# Patient Record
Sex: Male | Born: 1940 | Race: White | Hispanic: No | Marital: Single | Smoking: Former smoker
Health system: Southern US, Community
[De-identification: ages and names within clinical notes are randomized; demographics above are authoritative.]

## PROBLEM LIST (undated history)

## (undated) DIAGNOSIS — M1990 Unspecified osteoarthritis, unspecified site: Secondary | ICD-10-CM

## (undated) DIAGNOSIS — F329 Major depressive disorder, single episode, unspecified: Secondary | ICD-10-CM

## (undated) DIAGNOSIS — C61 Malignant neoplasm of prostate: Secondary | ICD-10-CM

## (undated) DIAGNOSIS — I2510 Atherosclerotic heart disease of native coronary artery without angina pectoris: Secondary | ICD-10-CM

## (undated) DIAGNOSIS — N319 Neuromuscular dysfunction of bladder, unspecified: Secondary | ICD-10-CM

## (undated) DIAGNOSIS — G8929 Other chronic pain: Secondary | ICD-10-CM

## (undated) DIAGNOSIS — C4490 Unspecified malignant neoplasm of skin, unspecified: Secondary | ICD-10-CM

## (undated) DIAGNOSIS — E042 Nontoxic multinodular goiter: Secondary | ICD-10-CM

## (undated) DIAGNOSIS — Z9981 Dependence on supplemental oxygen: Secondary | ICD-10-CM

## (undated) DIAGNOSIS — M545 Low back pain, unspecified: Secondary | ICD-10-CM

## (undated) DIAGNOSIS — J449 Chronic obstructive pulmonary disease, unspecified: Secondary | ICD-10-CM

## (undated) DIAGNOSIS — F32A Depression, unspecified: Secondary | ICD-10-CM

## (undated) DIAGNOSIS — I10 Essential (primary) hypertension: Secondary | ICD-10-CM

## (undated) DIAGNOSIS — E7800 Pure hypercholesterolemia, unspecified: Secondary | ICD-10-CM

## (undated) DIAGNOSIS — J189 Pneumonia, unspecified organism: Secondary | ICD-10-CM

## (undated) DIAGNOSIS — I219 Acute myocardial infarction, unspecified: Secondary | ICD-10-CM

## (undated) DIAGNOSIS — Z9289 Personal history of other medical treatment: Secondary | ICD-10-CM

## (undated) HISTORY — PX: TONSILLECTOMY: SUR1361

## (undated) HISTORY — PX: CARPAL TUNNEL RELEASE: SHX101

## (undated) HISTORY — PX: APPENDECTOMY: SHX54

## (undated) HISTORY — PX: INSERTION PROSTATE RADIATION SEED: SUR718

## (undated) HISTORY — PX: CYSTECTOMY: SUR359

## (undated) HISTORY — PX: WRIST FRACTURE SURGERY: SHX121

---

## 1989-01-05 DIAGNOSIS — J189 Pneumonia, unspecified organism: Secondary | ICD-10-CM

## 1989-01-05 HISTORY — DX: Pneumonia, unspecified organism: J18.9

## 1994-05-07 DIAGNOSIS — I219 Acute myocardial infarction, unspecified: Secondary | ICD-10-CM

## 1994-05-07 HISTORY — DX: Acute myocardial infarction, unspecified: I21.9

## 2007-05-08 DIAGNOSIS — Z9289 Personal history of other medical treatment: Secondary | ICD-10-CM

## 2007-05-08 HISTORY — PX: COLON SURGERY: SHX602

## 2007-05-08 HISTORY — DX: Personal history of other medical treatment: Z92.89

## 2011-05-08 HISTORY — PX: TRANSURETHRAL RESECTION OF PROSTATE: SHX73

## 2013-09-14 ENCOUNTER — Inpatient Hospital Stay (HOSPITAL_COMMUNITY): Payer: Medicare Other

## 2013-09-14 ENCOUNTER — Encounter (HOSPITAL_BASED_OUTPATIENT_CLINIC_OR_DEPARTMENT_OTHER): Payer: Self-pay | Admitting: Emergency Medicine

## 2013-09-14 ENCOUNTER — Inpatient Hospital Stay (HOSPITAL_BASED_OUTPATIENT_CLINIC_OR_DEPARTMENT_OTHER)
Admission: EM | Admit: 2013-09-14 | Discharge: 2013-09-17 | DRG: 190 | Disposition: A | Discharge status: Skilled Nursing Facility | Payer: Medicare Other | Attending: Internal Medicine | Admitting: Internal Medicine

## 2013-09-14 ENCOUNTER — Emergency Department (HOSPITAL_BASED_OUTPATIENT_CLINIC_OR_DEPARTMENT_OTHER): Payer: Medicare Other

## 2013-09-14 DIAGNOSIS — Z91199 Patient's noncompliance with other medical treatment and regimen due to unspecified reason: Secondary | ICD-10-CM

## 2013-09-14 DIAGNOSIS — K219 Gastro-esophageal reflux disease without esophagitis: Secondary | ICD-10-CM | POA: Diagnosis present

## 2013-09-14 DIAGNOSIS — E785 Hyperlipidemia, unspecified: Secondary | ICD-10-CM | POA: Diagnosis present

## 2013-09-14 DIAGNOSIS — S82839A Other fracture of upper and lower end of unspecified fibula, initial encounter for closed fracture: Secondary | ICD-10-CM | POA: Diagnosis present

## 2013-09-14 DIAGNOSIS — M1990 Unspecified osteoarthritis, unspecified site: Secondary | ICD-10-CM | POA: Diagnosis present

## 2013-09-14 DIAGNOSIS — J441 Chronic obstructive pulmonary disease with (acute) exacerbation: Secondary | ICD-10-CM

## 2013-09-14 DIAGNOSIS — Z66 Do not resuscitate: Secondary | ICD-10-CM | POA: Diagnosis present

## 2013-09-14 DIAGNOSIS — G2581 Restless legs syndrome: Secondary | ICD-10-CM | POA: Diagnosis present

## 2013-09-14 DIAGNOSIS — IMO0002 Reserved for concepts with insufficient information to code with codable children: Secondary | ICD-10-CM

## 2013-09-14 DIAGNOSIS — R627 Adult failure to thrive: Secondary | ICD-10-CM | POA: Diagnosis present

## 2013-09-14 DIAGNOSIS — Z9181 History of falling: Secondary | ICD-10-CM

## 2013-09-14 DIAGNOSIS — J449 Chronic obstructive pulmonary disease, unspecified: Secondary | ICD-10-CM | POA: Diagnosis present

## 2013-09-14 DIAGNOSIS — W19XXXA Unspecified fall, initial encounter: Secondary | ICD-10-CM

## 2013-09-14 DIAGNOSIS — E042 Nontoxic multinodular goiter: Secondary | ICD-10-CM | POA: Diagnosis present

## 2013-09-14 DIAGNOSIS — J440 Chronic obstructive pulmonary disease with acute lower respiratory infection: Principal | ICD-10-CM | POA: Diagnosis present

## 2013-09-14 DIAGNOSIS — I10 Essential (primary) hypertension: Secondary | ICD-10-CM | POA: Diagnosis present

## 2013-09-14 DIAGNOSIS — M069 Rheumatoid arthritis, unspecified: Secondary | ICD-10-CM | POA: Diagnosis present

## 2013-09-14 DIAGNOSIS — Y92009 Unspecified place in unspecified non-institutional (private) residence as the place of occurrence of the external cause: Secondary | ICD-10-CM

## 2013-09-14 DIAGNOSIS — E7800 Pure hypercholesterolemia, unspecified: Secondary | ICD-10-CM | POA: Diagnosis present

## 2013-09-14 DIAGNOSIS — Z8249 Family history of ischemic heart disease and other diseases of the circulatory system: Secondary | ICD-10-CM

## 2013-09-14 DIAGNOSIS — E2749 Other adrenocortical insufficiency: Secondary | ICD-10-CM | POA: Diagnosis present

## 2013-09-14 DIAGNOSIS — G8929 Other chronic pain: Secondary | ICD-10-CM | POA: Diagnosis present

## 2013-09-14 DIAGNOSIS — Z9119 Patient's noncompliance with other medical treatment and regimen: Secondary | ICD-10-CM

## 2013-09-14 DIAGNOSIS — I2510 Atherosclerotic heart disease of native coronary artery without angina pectoris: Secondary | ICD-10-CM

## 2013-09-14 DIAGNOSIS — Z820 Family history of epilepsy and other diseases of the nervous system: Secondary | ICD-10-CM

## 2013-09-14 DIAGNOSIS — Z79899 Other long term (current) drug therapy: Secondary | ICD-10-CM

## 2013-09-14 DIAGNOSIS — Z9981 Dependence on supplemental oxygen: Secondary | ICD-10-CM

## 2013-09-14 DIAGNOSIS — I252 Old myocardial infarction: Secondary | ICD-10-CM

## 2013-09-14 DIAGNOSIS — E872 Acidosis, unspecified: Secondary | ICD-10-CM | POA: Diagnosis present

## 2013-09-14 DIAGNOSIS — Z602 Problems related to living alone: Secondary | ICD-10-CM

## 2013-09-14 DIAGNOSIS — R000 Tachycardia, unspecified: Secondary | ICD-10-CM | POA: Diagnosis present

## 2013-09-14 DIAGNOSIS — J209 Acute bronchitis, unspecified: Principal | ICD-10-CM | POA: Diagnosis present

## 2013-09-14 DIAGNOSIS — Z87891 Personal history of nicotine dependence: Secondary | ICD-10-CM

## 2013-09-14 DIAGNOSIS — Z7982 Long term (current) use of aspirin: Secondary | ICD-10-CM

## 2013-09-14 DIAGNOSIS — R531 Weakness: Secondary | ICD-10-CM

## 2013-09-14 DIAGNOSIS — Z888 Allergy status to other drugs, medicaments and biological substances status: Secondary | ICD-10-CM

## 2013-09-14 DIAGNOSIS — N319 Neuromuscular dysfunction of bladder, unspecified: Secondary | ICD-10-CM | POA: Diagnosis present

## 2013-09-14 DIAGNOSIS — J9620 Acute and chronic respiratory failure, unspecified whether with hypoxia or hypercapnia: Secondary | ICD-10-CM | POA: Diagnosis present

## 2013-09-14 HISTORY — DX: Essential (primary) hypertension: I10

## 2013-09-14 HISTORY — DX: Dependence on supplemental oxygen: Z99.81

## 2013-09-14 HISTORY — DX: Nontoxic multinodular goiter: E04.2

## 2013-09-14 HISTORY — DX: Unspecified osteoarthritis, unspecified site: M19.90

## 2013-09-14 HISTORY — DX: Low back pain, unspecified: M54.50

## 2013-09-14 HISTORY — DX: Atherosclerotic heart disease of native coronary artery without angina pectoris: I25.10

## 2013-09-14 HISTORY — DX: Major depressive disorder, single episode, unspecified: F32.9

## 2013-09-14 HISTORY — DX: Depression, unspecified: F32.A

## 2013-09-14 HISTORY — DX: Unspecified malignant neoplasm of skin, unspecified: C44.90

## 2013-09-14 HISTORY — DX: Acute myocardial infarction, unspecified: I21.9

## 2013-09-14 HISTORY — DX: Other chronic pain: G89.29

## 2013-09-14 HISTORY — DX: Pure hypercholesterolemia, unspecified: E78.00

## 2013-09-14 HISTORY — DX: Neuromuscular dysfunction of bladder, unspecified: N31.9

## 2013-09-14 HISTORY — DX: Pneumonia, unspecified organism: J18.9

## 2013-09-14 HISTORY — DX: Malignant neoplasm of prostate: C61

## 2013-09-14 HISTORY — DX: Chronic obstructive pulmonary disease, unspecified: J44.9

## 2013-09-14 HISTORY — DX: Low back pain: M54.5

## 2013-09-14 HISTORY — DX: Personal history of other medical treatment: Z92.89

## 2013-09-14 LAB — URINALYSIS, ROUTINE W REFLEX MICROSCOPIC
Glucose, UA: NEGATIVE mg/dL
HGB URINE DIPSTICK: NEGATIVE
KETONES UR: 40 mg/dL — AB
LEUKOCYTES UA: NEGATIVE
Nitrite: NEGATIVE
PH: 7.5 (ref 5.0–8.0)
Protein, ur: 100 mg/dL — AB
Specific Gravity, Urine: 1.02 (ref 1.005–1.030)
Urobilinogen, UA: 1 mg/dL (ref 0.0–1.0)

## 2013-09-14 LAB — I-STAT ARTERIAL BLOOD GAS, ED
ACID-BASE EXCESS: 6 mmol/L — AB (ref 0.0–2.0)
BICARBONATE: 32.2 meq/L — AB (ref 20.0–24.0)
O2 SAT: 94 %
TCO2: 34 mmol/L (ref 0–100)
pCO2 arterial: 50.7 mmHg — ABNORMAL HIGH (ref 35.0–45.0)
pH, Arterial: 7.41 (ref 7.350–7.450)
pO2, Arterial: 71 mmHg — ABNORMAL LOW (ref 80.0–100.0)

## 2013-09-14 LAB — CBC
HCT: 33.5 % — ABNORMAL LOW (ref 39.0–52.0)
Hemoglobin: 10.9 g/dL — ABNORMAL LOW (ref 13.0–17.0)
MCH: 31.1 pg (ref 26.0–34.0)
MCHC: 32.5 g/dL (ref 30.0–36.0)
MCV: 95.7 fL (ref 78.0–100.0)
PLATELETS: 141 10*3/uL — AB (ref 150–400)
RBC: 3.5 MIL/uL — ABNORMAL LOW (ref 4.22–5.81)
RDW: 13.1 % (ref 11.5–15.5)
WBC: 16.6 10*3/uL — AB (ref 4.0–10.5)

## 2013-09-14 LAB — CBC WITH DIFFERENTIAL/PLATELET
BAND NEUTROPHILS: 4 % (ref 0–10)
BLASTS: 0 %
Basophils Absolute: 0 10*3/uL (ref 0.0–0.1)
Basophils Relative: 0 % (ref 0–1)
Eosinophils Absolute: 0.2 10*3/uL (ref 0.0–0.7)
Eosinophils Relative: 1 % (ref 0–5)
HEMATOCRIT: 34.8 % — AB (ref 39.0–52.0)
Hemoglobin: 11.4 g/dL — ABNORMAL LOW (ref 13.0–17.0)
LYMPHS PCT: 6 % — AB (ref 12–46)
Lymphs Abs: 1.1 10*3/uL (ref 0.7–4.0)
MCH: 32.1 pg (ref 26.0–34.0)
MCHC: 32.8 g/dL (ref 30.0–36.0)
MCV: 98 fL (ref 78.0–100.0)
Metamyelocytes Relative: 0 %
Monocytes Absolute: 0.4 10*3/uL (ref 0.1–1.0)
Monocytes Relative: 2 % — ABNORMAL LOW (ref 3–12)
Myelocytes: 0 %
NEUTROS ABS: 16 10*3/uL — AB (ref 1.7–7.7)
NRBC: 0 /100{WBCs}
Neutrophils Relative %: 87 % — ABNORMAL HIGH (ref 43–77)
PROMYELOCYTES ABS: 0 %
Platelets: ADEQUATE 10*3/uL (ref 150–400)
RBC: 3.55 MIL/uL — ABNORMAL LOW (ref 4.22–5.81)
RDW: 12.8 % (ref 11.5–15.5)
Smear Review: ADEQUATE
WBC: 17.7 10*3/uL — ABNORMAL HIGH (ref 4.0–10.5)

## 2013-09-14 LAB — TSH: TSH: 0.157 u[IU]/mL — ABNORMAL LOW (ref 0.350–4.500)

## 2013-09-14 LAB — COMPREHENSIVE METABOLIC PANEL
ALT: 13 U/L (ref 0–53)
AST: 15 U/L (ref 0–37)
Albumin: 3.4 g/dL — ABNORMAL LOW (ref 3.5–5.2)
Alkaline Phosphatase: 95 U/L (ref 39–117)
BILIRUBIN TOTAL: 0.4 mg/dL (ref 0.3–1.2)
BUN: 10 mg/dL (ref 6–23)
CHLORIDE: 98 meq/L (ref 96–112)
CO2: 26 meq/L (ref 19–32)
Calcium: 9.4 mg/dL (ref 8.4–10.5)
Creatinine, Ser: 0.6 mg/dL (ref 0.50–1.35)
GFR calc Af Amer: >90 mL/min (ref >90)
GLUCOSE: 125 mg/dL — AB (ref 70–99)
Potassium: 4 mEq/L (ref 3.7–5.3)
SODIUM: 139 meq/L (ref 137–147)
Total Protein: 7.2 g/dL (ref 6.0–8.3)

## 2013-09-14 LAB — URINE MICROSCOPIC-ADD ON

## 2013-09-14 LAB — CREATININE, SERUM
Creatinine, Ser: 0.52 mg/dL (ref 0.50–1.35)
GFR calc Af Amer: >90 mL/min (ref >90)
GFR calc non Af Amer: >90 mL/min (ref >90)

## 2013-09-14 LAB — CK: Total CK: 82 U/L (ref 7–232)

## 2013-09-14 LAB — PRO B NATRIURETIC PEPTIDE: PRO B NATRI PEPTIDE: 227.7 pg/mL — AB (ref 0–125)

## 2013-09-14 LAB — TROPONIN I: Troponin I: <0.3 ng/mL (ref <0.30)

## 2013-09-14 MED ORDER — PANTOPRAZOLE SODIUM 40 MG PO TBEC
40.0000 mg | DELAYED_RELEASE_TABLET | Freq: Every day | ORAL | Status: DC
  Administered 2013-09-14 – 2013-09-17 (×4): 40 mg via ORAL
  Filled 2013-09-14 (×4): qty 1

## 2013-09-14 MED ORDER — ROPINIROLE HCL 1 MG PO TABS
2.0000 mg | ORAL_TABLET | Freq: Every day | ORAL | Status: DC
  Administered 2013-09-14 – 2013-09-16 (×3): 2 mg via ORAL
  Filled 2013-09-14 (×4): qty 2

## 2013-09-14 MED ORDER — BUDESONIDE-FORMOTEROL FUMARATE 160-4.5 MCG/ACT IN AERO
2.0000 | INHALATION_SPRAY | Freq: Two times a day (BID) | RESPIRATORY_TRACT | Status: DC
  Administered 2013-09-14 – 2013-09-17 (×6): 2 via RESPIRATORY_TRACT
  Filled 2013-09-14: qty 6

## 2013-09-14 MED ORDER — ASPIRIN 81 MG PO TABS: 81.0000 mg | ORAL_TABLET | Freq: Every day | ORAL | Status: DC

## 2013-09-14 MED ORDER — POLYETHYLENE GLYCOL 3350 17 G PO PACK
17.0000 g | PACK | Freq: Every day | ORAL | Status: DC
  Administered 2013-09-14 – 2013-09-17 (×4): 17 g via ORAL
  Filled 2013-09-14 (×4): qty 1

## 2013-09-14 MED ORDER — LEVOFLOXACIN 750 MG PO TABS
750.0000 mg | ORAL_TABLET | Freq: Once | ORAL | Status: AC
  Administered 2013-09-14: 750 mg via ORAL
  Filled 2013-09-14: qty 1

## 2013-09-14 MED ORDER — HYDROCODONE-ACETAMINOPHEN 10-325 MG PO TABS
1.0000 | ORAL_TABLET | Freq: Four times a day (QID) | ORAL | Status: DC | PRN
  Administered 2013-09-15 – 2013-09-17 (×8): 1 via ORAL
  Filled 2013-09-14 (×6): qty 1
  Filled 2013-09-14: qty 2
  Filled 2013-09-14 (×2): qty 1

## 2013-09-14 MED ORDER — ALBUTEROL SULFATE (2.5 MG/3ML) 0.083% IN NEBU
2.5000 mg | INHALATION_SOLUTION | RESPIRATORY_TRACT | Status: DC | PRN
  Administered 2013-09-16 – 2013-09-17 (×2): 2.5 mg via RESPIRATORY_TRACT
  Filled 2013-09-14 (×3): qty 3

## 2013-09-14 MED ORDER — CALCIUM CARBONATE 600 MG PO TABS: 600.0000 mg | ORAL_TABLET | Freq: Two times a day (BID) | ORAL | Status: DC

## 2013-09-14 MED ORDER — TIOTROPIUM BROMIDE MONOHYDRATE 18 MCG IN CAPS: 18.0000 ug | ORAL_CAPSULE | Freq: Every day | RESPIRATORY_TRACT | Status: DC

## 2013-09-14 MED ORDER — NICOTINE POLACRILEX 4 MG MT LOZG
4.0000 mg | LOZENGE | OROMUCOSAL | Status: DC | PRN
  Filled 2013-09-14: qty 1

## 2013-09-14 MED ORDER — PREDNISONE 50 MG PO TABS
60.0000 mg | ORAL_TABLET | Freq: Every day | ORAL | Status: DC
  Administered 2013-09-15 – 2013-09-17 (×3): 60 mg via ORAL
  Filled 2013-09-14 (×4): qty 1

## 2013-09-14 MED ORDER — CALCIUM CARBONATE 1250 (500 CA) MG PO TABS
1.0000 | ORAL_TABLET | Freq: Two times a day (BID) | ORAL | Status: DC
  Administered 2013-09-14 – 2013-09-17 (×7): 500 mg via ORAL
  Filled 2013-09-14 (×8): qty 1

## 2013-09-14 MED ORDER — TRAZODONE HCL 100 MG PO TABS
100.0000 mg | ORAL_TABLET | Freq: Every day | ORAL | Status: DC
  Administered 2013-09-14 – 2013-09-16 (×3): 100 mg via ORAL
  Filled 2013-09-14 (×4): qty 1

## 2013-09-14 MED ORDER — CARVEDILOL 12.5 MG PO TABS
12.5000 mg | ORAL_TABLET | Freq: Two times a day (BID) | ORAL | Status: DC
  Administered 2013-09-14 – 2013-09-17 (×7): 12.5 mg via ORAL
  Filled 2013-09-14 (×8): qty 1

## 2013-09-14 MED ORDER — DOCUSATE SODIUM 100 MG PO CAPS
100.0000 mg | ORAL_CAPSULE | Freq: Two times a day (BID) | ORAL | Status: DC
  Administered 2013-09-14 – 2013-09-17 (×4): 100 mg via ORAL
  Filled 2013-09-14 (×7): qty 1

## 2013-09-14 MED ORDER — HYDROCODONE-ACETAMINOPHEN 5-325 MG PO TABS
2.0000 | ORAL_TABLET | Freq: Once | ORAL | Status: AC
  Administered 2013-09-14: 2 via ORAL
  Filled 2013-09-14: qty 2

## 2013-09-14 MED ORDER — HEPARIN SODIUM (PORCINE) 5000 UNIT/ML IJ SOLN
5000.0000 [IU] | Freq: Three times a day (TID) | INTRAMUSCULAR | Status: DC
  Administered 2013-09-14 – 2013-09-16 (×6): 5000 [IU] via SUBCUTANEOUS
  Filled 2013-09-14 (×9): qty 1

## 2013-09-14 MED ORDER — ASPIRIN 81 MG PO CHEW
81.0000 mg | CHEWABLE_TABLET | Freq: Every day | ORAL | Status: DC
  Administered 2013-09-14 – 2013-09-17 (×4): 81 mg via ORAL
  Filled 2013-09-14 (×4): qty 1

## 2013-09-14 MED ORDER — TIOTROPIUM BROMIDE MONOHYDRATE 18 MCG IN CAPS
18.0000 ug | ORAL_CAPSULE | Freq: Every day | RESPIRATORY_TRACT | Status: DC
  Administered 2013-09-15 – 2013-09-17 (×3): 18 ug via RESPIRATORY_TRACT
  Filled 2013-09-14: qty 5

## 2013-09-14 MED ORDER — BIOTENE DRY MOUTH MT LIQD
15.0000 mL | Freq: Two times a day (BID) | OROMUCOSAL | Status: DC
  Administered 2013-09-14 – 2013-09-17 (×6): 15 mL via OROMUCOSAL

## 2013-09-14 MED ORDER — ALBUTEROL SULFATE (2.5 MG/3ML) 0.083% IN NEBU
2.5000 mg | INHALATION_SOLUTION | Freq: Four times a day (QID) | RESPIRATORY_TRACT | Status: DC
  Administered 2013-09-14: 2.5 mg via RESPIRATORY_TRACT

## 2013-09-14 NOTE — ED Notes (Signed)
[facility] was notified and they have no truck until 5 or 6 pm.  Was told to call [facility] per [place] ---they should have one within the next hour.

## 2013-09-14 NOTE — H&P (Signed)
[facility] History and Physical  Christopher Orozco ZOX:[id number] DOB: [birth year] DOA: 09/14/2013  Referring physician: ED PCP: No primary provider on file.  Specialists: none  Chief Complaint: Dizzy and Sob  HPI: 73 y/o ? CAd 1996-had NSTEMI, COPD, Gold stage 'D' on chronic home o@ for at least past 3 yryrs-smoker till June 2014 [smoked ~ 2ppd], Rh arthritis [used to be on steroids-tapering now slowly per pulm], Restless leg syndrome, Bladder issues-TURP with incontinence-suppsoed tio Intermittently cath, Degen disc diseaserecent transplant from [place] with 1-2 /7 h/o ?  weakness and sob, feel this am on going to Rr .  Couldn't get up--sats were 70-72% on 3 liters.  Has had ~ 40 lb weight gain, with ? abd girth as well as Le swelling and has been told to wear compression hose which he is non compliant with. He has had no PND and usually uses 3-4 pills to sleep upright at night and has been sleeping on the couch otherwise not currently has a hospital bed He has had no specific lower extremity swelling He has had no chest pain no nausea no vomiting no sick contacts no ill contacts no chills no dysuria no diarrhea no productive cough. He fell down and seems to follow him on his left ankle as well as his left knee and thinks that he twisted his ankle. He did not have any dizziness or any pre-syncopalhe just felt weak overall and did not have any spinning of his head or presyncopal episodes He's never had a stroke or seizure  Used to be more active-Has been declining and [facility] has been asking about routine screening such as AAA screening, colonoscopy and his daughter [ who is an Therapist, sports with [employer] ] has had discussions with PCP at [place] regarding COde status as well as other concerns.  Review of Systems:  10 item 12 organ system review as above  Past Medical History  Diagnosis Date  . COPD (chronic obstructive pulmonary disease)   . Coronary artery disease   . MI, old 51  . Hypertension   .  Elevated cholesterol   . Chronic pain   . Arthritis   . Multiple thyroid nodules   . Neurogenic bladder     History reviewed. No pertinent past surgical history. Social History:  History   Social History Narrative  . No narrative on file    Allergies  Allergen Reactions  . Bupropion     disorientation  . Gabapentin     disorientation  . Ultram [Tramadol] Other (See Comments)    disorientation    History reviewed. No pertinent family history.  Mother had congestive heart failure Father had Alzheimer's dementia   Prior to Admission medications   Medication Sig Start Date End Date Taking? Authorizing Provider  albuterol (PROVENTIL HFA;VENTOLIN HFA) 108 (90 BASE) MCG/ACT inhaler Inhale into the lungs every 6 (six) hours as needed for wheezing or shortness of breath.   Yes Historical Provider, MD  aspirin 81 MG tablet Take 81 mg by mouth daily.   Yes Historical Provider, MD  budesonide-formoterol (SYMBICORT) 160-4.5 MCG/ACT inhaler Inhale 2 puffs into the lungs 2 (two) times daily.   Yes Historical Provider, MD  calcium carbonate (OS-CAL) 600 MG TABS tablet Take 600 mg by mouth 2 (two) times daily with a meal.   Yes Historical Provider, MD  carvedilol (COREG) 12.5 MG tablet Take 12.5 mg by mouth 2 (two) times daily with a meal.   Yes Historical Provider, MD  docusate sodium (COLACE)  100 MG capsule Take 100 mg by mouth 2 (two) times daily.   Yes Historical Provider, MD  HYDROcodone-acetaminophen (NORCO) 10-325 MG per tablet Take 1 tablet by mouth every 6 (six) hours as needed.   Yes Historical Provider, MD  nicotine polacrilex (COMMIT) 4 MG lozenge Take 4 mg by mouth as needed for smoking cessation.   Yes Historical Provider, MD  omeprazole (PRILOSEC) 20 MG capsule Take 20 mg by mouth daily.   Yes Historical Provider, MD  polyethylene glycol (MIRALAX / GLYCOLAX) packet Take 17 g by mouth daily.   Yes Historical Provider, MD  predniSONE (DELTASONE) 10 MG tablet Take 10 mg by mouth  daily with breakfast. Tapering dose per PCP   Yes Historical Provider, MD  rOPINIRole (REQUIP) 2 MG tablet Take 2 mg by mouth at bedtime.   Yes Historical Provider, MD  simvastatin (ZOCOR) 40 MG tablet Take 40 mg by mouth daily.   Yes Historical Provider, MD  tiotropium (SPIRIVA) 18 MCG inhalation capsule Place 18 mcg into inhaler and inhale daily.   Yes Historical Provider, MD  traZODone (DESYREL) 100 MG tablet Take 100 mg by mouth at bedtime.   Yes Historical Provider, MD   Physical Exam: Filed Vitals:   09/14/13 0929 09/14/13 1229  BP: 136/72 143/60  Pulse: 94 96  Temp: 98.4 F (36.9 C) 98 F (36.7 C)  TempSrc: Oral Oral  Resp: 24 16  SpO2: 100% 96%     General:   alert, slightly disheveled, anicteric, no pallor  Eyes:  EOMI, fundus not evaluated,  ENT: Midline uvula, facial symmetry present,   Neck: Soft supple no thyromegaly noted, no JVD noted  Cardiovascular:  S1-S2 regular rate rhythm , no murmur no rub   Respiratory: Decreased air entry bilaterally posteriorly. No fremitus or resonance   Abdomen:  soft nontender nondistended  Skin:  no lower extremity edema  Musculoskeletal:  range of motion to the left ankle is slightly painful . Anteroposterior torus testis slightly positive . Palpation of the navicular bones does not reveal any overt tenderness . Inversion  , he version is uncomfortable but not painful   Psychiatric: Euthymic   Neurologic: Grossly intact   Labs on Admission:  Basic Metabolic Panel:  Recent Labs Lab 09/14/13 1010  NA 139  K 4.0  CL 98  CO2 26  GLUCOSE 125*  BUN 10  CREATININE 0.60  CALCIUM 9.4   Liver Function Tests:  Recent Labs Lab 09/14/13 1010  AST 15  ALT 13  ALKPHOS 95  BILITOT 0.4  PROT 7.2  ALBUMIN 3.4*   No results found for this basename: LIPASE, AMYLASE,  in the last 168 hours No results found for this basename: AMMONIA,  in the last 168 hours CBC:  Recent Labs Lab 09/14/13 1010  WBC 17.7*  NEUTROABS  16.0*  HGB 11.4*  HCT 34.8*  MCV 98.0  PLT PLATELET CLUMPS NOTED ON SMEAR, COUNT APPEARS ADEQUATE   Cardiac Enzymes:  Recent Labs Lab 09/14/13 1010  CKTOTAL 82  TROPONINI <0.30    BNP (last 3 results) No results found for this basename: PROBNP,  in the last 8760 hours CBG: No results found for this basename: GLUCAP,  in the last 168 hours  Radiological Exams on Admission: Dg Chest 2 View  09/14/2013   CLINICAL DATA:  Shortness of breath.  EXAM: CHEST  2 VIEW  COMPARISON:  None.  FINDINGS: There is hyperinflation of the lungs compatible with COPD. No confluent opacities or effusions. Heart is  normal size. Mild peribronchial thickening and interstitial prominence, likely bronchitis.  IMPRESSION: COPD/ bronchitic changes.   Electronically Signed   By: Rolm Baptise M.D.   On: 09/14/2013 11:11   Dg Hip Complete Left  09/14/2013   CLINICAL DATA:  Fall, pain.  EXAM: LEFT HIP - COMPLETE 2+ VIEW  COMPARISON:  None.  FINDINGS: Mild symmetric joint space narrowing within the hip joints bilaterally. No acute bony abnormality. Specifically, no fracture, subluxation, or dislocation. Soft tissues are intact. Radiation seeds in the region of the prostate.  IMPRESSION: No acute bony abnormality.   Electronically Signed   By: Rolm Baptise M.D.   On: 09/14/2013 10:53    EKG: Independently reviewed. Chest x-ray 120 PR interval 0 0.04 , QRS axis 70 , and ST-T wave inversions   Assessment/Plan Active Problems:    Acute mixed respiratory failure-elevated CO2 component as well as hypoxia on ABG. Currently in no distress. Will get a screening proBNP to rule out covert heart failure.  EKG does not show any LVH therefore I do not think echo is indicated at this point in time  COPD (chronic obstructive pulmonary disease)-patient hypoxic is resolved. Will give nebulizations every 4 scheduled overnight and reassess in the morning.  Will continue oxygen 3 L an attempt to wean further. We will give a burst of  steroids 60 mg prednisone. He will continue on his Symbicort twice a day. In addition we will add tiotropium to his medications to see if this helps   Failure to thrive-daughter relates slow progressive decline over the past year with decreased mobility. We'll have therapy see him once x-rays are performed--He may need Pallaitive eval eevntually from what daughter states   Fall at home-patient fell and had hip x-rays. He's complaining of ankle pain therefore we'll get knee and ankle x-rays of the left lower extremity   Hypertension-he will continue Coreg 12.5 milligrams daily   Elevated cholesterol-hold statin for now   Chronic pain-patient has had chronic pain for a long time at least 10 years to 12 years and has been told in the past that he does not have rheumatoid arthritis.Marland Kitchen continue his current pain medications.     Multiple thyroid nodules-as reported as benign and has not wanted a workup . Get a screening TSH    Neurogenic bladder-would like to avoid catheter . Check postvoid residuals if patient does not go for a long period of time    Arthritis-see above    CAD (coronary artery disease)-currently quite.  Continue aspirin    65 minutes Long discussion with daughter at bedside Inpatient DO NOT RESUSCITATE confirmed at bedside   [facility] Pager [phone]  If 7PM-7AM, please contact night-coverage [email or web address] Password [email or web address] 09/14/2013, 3:08 PM

## 2013-09-14 NOTE — ED Notes (Signed)
Per EMS:  Pt from home.  States that he fell when trying to get to the bathroom this morning.  Reports that his COPD was worsened when he was trying to get up.  Pt did have BM on himself.  22 ga (R) hand.  2 duo nebs.  125mg  solu-medrol.

## 2013-09-14 NOTE — ED Provider Notes (Addendum)
CSN: [id number]     Arrival date & time 09/14/13  9509 History   First MD Initiated Contact with Patient 09/14/13 0930     Chief Complaint  Patient presents with  . Fall     (Consider location/radiation/quality/duration/timing/severity/associated sxs/prior Treatment) Patient is a 73 y.o. male presenting with fall and shortness of breath. The history is provided by the patient.  Fall This is a recurrent problem. The current episode started 1 to 2 hours ago. The problem occurs constantly. The problem has not changed since onset.Associated symptoms include shortness of breath. Pertinent negatives include no chest pain and no abdominal pain. Associated symptoms comments: Was walking to the bathroom and became so weak his legs gave out and he was unable to get up.  Denies head injury, neck injury or LOC.. Nothing aggravates the symptoms. Nothing relieves the symptoms. He has tried nothing for the symptoms.  Shortness of Breath Severity:  Moderate Onset quality:  Gradual Duration:  1 week Timing:  Constant Progression:  Worsening Chronicity: chronic COPD on 4L of O2 but worse over the last 1 week. Context: not URI   Relieved by:  Sitting up, rest and oxygen Worsened by:  Activity and coughing Associated symptoms: cough, sputum production and wheezing   Associated symptoms: no abdominal pain, no chest pain, no diaphoresis, no fever, no syncope and no vomiting   Risk factors comment:  Copd   Past Medical History  Diagnosis Date  . COPD (chronic obstructive pulmonary disease)    History reviewed. No pertinent past surgical history. History reviewed. No pertinent family history. History  Substance Use Topics  . Smoking status: Unknown If Ever Smoked  . Smokeless tobacco: Not on file  . Alcohol Use: Not on file    Review of Systems  Constitutional: Negative for fever and diaphoresis.  Respiratory: Positive for cough, sputum production, shortness of breath and wheezing.    Cardiovascular: Negative for chest pain and syncope.  Gastrointestinal: Negative for vomiting and abdominal pain.  All other systems reviewed and are negative.     Allergies  Review of patient's allergies indicates not on file.  Home Medications   Prior to Admission medications   Not on File   There were no vitals taken for this visit. Physical Exam  Nursing note and vitals reviewed. Constitutional: He is oriented to person, place, and time. He appears well-developed and well-nourished. No distress.  HENT:  Head: Normocephalic and atraumatic.  Mouth/Throat: Oropharynx is clear and moist.  Eyes: Conjunctivae and EOM are normal. Pupils are equal, round, and reactive to light.  Neck: Normal range of motion. Neck supple. No spinous process tenderness and no muscular tenderness present.  Cardiovascular: Regular rhythm and intact distal pulses.  Tachycardia present.   No murmur heard. Pulmonary/Chest: No accessory muscle usage. Tachypnea noted. No respiratory distress. He has wheezes. He has no rales.  Only able to speak in few word sentences  Abdominal: Soft. He exhibits no distension. There is no tenderness. There is no rebound and no guarding.  Musculoskeletal: Normal range of motion. He exhibits tenderness. He exhibits no edema.  Mild tenderness with range of motion of left hip.  No deformity and 2+ DP pulse with normal sensation  Neurological: He is alert and oriented to person, place, and time.  Skin: Skin is warm and dry. No rash noted. No erythema.  Psychiatric: He has a normal mood and affect. His behavior is normal.    ED Course  Procedures (including critical care time) Labs Review  Labs Reviewed  CBC WITH DIFFERENTIAL - Abnormal; Notable for the following:    WBC 17.7 (*)    RBC 3.55 (*)    Hemoglobin 11.4 (*)    HCT 34.8 (*)    Neutrophils Relative % 87 (*)    Lymphocytes Relative 6 (*)    Monocytes Relative 2 (*)    Neutro Abs 16.0 (*)    All other components  within normal limits  COMPREHENSIVE METABOLIC PANEL - Abnormal; Notable for the following:    Glucose, Bld 125 (*)    Albumin 3.4 (*)    All other components within normal limits  URINALYSIS, ROUTINE W REFLEX MICROSCOPIC - Abnormal; Notable for the following:    Bilirubin Urine SMALL (*)    Ketones, ur 40 (*)    Protein, ur 100 (*)    All other components within normal limits  URINE MICROSCOPIC-ADD ON - Abnormal; Notable for the following:    Casts HYALINE CASTS (*)    All other components within normal limits  I-STAT ARTERIAL BLOOD GAS, ED - Abnormal; Notable for the following:    pCO2 arterial 50.7 (*)    pO2, Arterial 71.0 (*)    Bicarbonate 32.2 (*)    Acid-Base Excess 6.0 (*)    All other components within normal limits  CK  TROPONIN I    Imaging Review Dg Chest 2 View  09/14/2013   CLINICAL DATA:  Shortness of breath.  EXAM: CHEST  2 VIEW  COMPARISON:  None.  FINDINGS: There is hyperinflation of the lungs compatible with COPD. No confluent opacities or effusions. Heart is normal size. Mild peribronchial thickening and interstitial prominence, likely bronchitis.  IMPRESSION: COPD/ bronchitic changes.   Electronically Signed   By: Rolm Baptise M.D.   On: 09/14/2013 11:11   Dg Hip Complete Left  09/14/2013   CLINICAL DATA:  Fall, pain.  EXAM: LEFT HIP - COMPLETE 2+ VIEW  COMPARISON:  None.  FINDINGS: Mild symmetric joint space narrowing within the hip joints bilaterally. No acute bony abnormality. Specifically, no fracture, subluxation, or dislocation. Soft tissues are intact. Radiation seeds in the region of the prostate.  IMPRESSION: No acute bony abnormality.   Electronically Signed   By: Rolm Baptise M.D.   On: 09/14/2013 10:53     EKG Interpretation   Date/Time:  Monday Sep 14 2013 09:54:44 EDT Ventricular Rate:  120 PR Interval:  152 QRS Duration: 88 QT Interval:  318 QTC Calculation: 449 R Axis:   81 Text Interpretation:  Sinus tachycardia Otherwise normal ECG No  previous  tracing Confirmed by Maryan Rued  MD, Loree Fee ([id number]) on 09/14/2013 10:31:31  AM      MDM   Final diagnoses:  COPD exacerbation  Weakness  Fall    Patient presents after a fall at home when he states he was too weak to walk to the bathroom. He denies head injury or LOC intakes no anticoagulations. Patient has a history of COPD on 4 L of oxygen chronically. He states over the last one week he said worsening shortness of breath and productive cough. 2 days ago he admits he was too weak to get off the couch and laid on the couch for about 20 hours before he was finally able to get up. He lives alone but his daughter is about 10 minutes away. He called her today when he fell and EMS arrived. Patient was given Solu-Medrol and to do nebs in route and upon arrival here he is wheezing diffusely but  satting 93% on his home liters. He has some mild left hip pain low suspicion for fracture at this time. No other signs of injury. Concern for worsening COPD exacerbation versus infection causing patient's weakness and fall today. Low suspicion for any acute injury.  We'll continue to monitor.  CBC, CMP, CK, troponin, ABG, chest x-ray, film pending  12:40 PM Leukocytosis of 17.000 and ABG with compensated resp acidosis.  Chest x-ray is without acute findings the film is negative. UA without signs of infection. The patient would benefit from admission to generalized weakness and COPD exacerbation with multiple falls. Discussed this with patient and family and they're agreeable to the plan.  Blanchie Dessert, MD 09/14/13 1241  Blanchie Dessert, MD 09/14/13 1241  Blanchie Dessert, MD 09/14/13 1249

## 2013-09-14 NOTE — Progress Notes (Signed)
73 year old male with history of COPD on 4 L of oxygen chronically, history of coronary artery disease presented to [facility] today for worsening shortness of breath and productive cough. Patient has also been feeling weak for several days and fell today at home. Patient did not hit his head. Chest x-ray negative for infiltrates, ABG appears to be okay. Patient has had wheezing, given albuterol nebulizers. Admit for COPD exacerbation  Accepted, team 10, medical floor.

## 2013-09-14 NOTE — ED Notes (Signed)
Right Radial Artery x 1 attempt to obtain ABG and labs.  Pressure held till bleeding stopped.  No complications noted.

## 2013-09-15 DIAGNOSIS — W19XXXA Unspecified fall, initial encounter: Secondary | ICD-10-CM

## 2013-09-15 DIAGNOSIS — I2510 Atherosclerotic heart disease of native coronary artery without angina pectoris: Secondary | ICD-10-CM

## 2013-09-15 DIAGNOSIS — I10 Essential (primary) hypertension: Secondary | ICD-10-CM

## 2013-09-15 LAB — COMPREHENSIVE METABOLIC PANEL
ALK PHOS: 101 U/L (ref 39–117)
ALT: 12 U/L (ref 0–53)
AST: 16 U/L (ref 0–37)
Albumin: 3.2 g/dL — ABNORMAL LOW (ref 3.5–5.2)
BUN: 14 mg/dL (ref 6–23)
CHLORIDE: 98 meq/L (ref 96–112)
CO2: 30 mEq/L (ref 19–32)
Calcium: 9.4 mg/dL (ref 8.4–10.5)
Creatinine, Ser: 0.52 mg/dL (ref 0.50–1.35)
GFR calc non Af Amer: >90 mL/min (ref >90)
GLUCOSE: 141 mg/dL — AB (ref 70–99)
Potassium: 3.9 mEq/L (ref 3.7–5.3)
SODIUM: 140 meq/L (ref 137–147)
TOTAL PROTEIN: 7 g/dL (ref 6.0–8.3)

## 2013-09-15 LAB — CBC
HEMATOCRIT: 31.5 % — AB (ref 39.0–52.0)
Hemoglobin: 10.2 g/dL — ABNORMAL LOW (ref 13.0–17.0)
MCH: 30.8 pg (ref 26.0–34.0)
MCHC: 32.4 g/dL (ref 30.0–36.0)
MCV: 95.2 fL (ref 78.0–100.0)
PLATELETS: 168 10*3/uL (ref 150–400)
RBC: 3.31 MIL/uL — ABNORMAL LOW (ref 4.22–5.81)
RDW: 13.1 % (ref 11.5–15.5)
WBC: 15.6 10*3/uL — AB (ref 4.0–10.5)

## 2013-09-15 LAB — PROTIME-INR
INR: 1.01 (ref 0.00–1.49)
Prothrombin Time: 13.1 seconds (ref 11.6–15.2)

## 2013-09-15 MED ORDER — SIMVASTATIN 40 MG PO TABS
40.0000 mg | ORAL_TABLET | Freq: Every day | ORAL | Status: DC
  Administered 2013-09-15 – 2013-09-17 (×3): 40 mg via ORAL
  Filled 2013-09-15 (×3): qty 1

## 2013-09-15 MED ORDER — FUROSEMIDE 10 MG/ML IJ SOLN
20.0000 mg | Freq: Once | INTRAMUSCULAR | Status: AC
  Administered 2013-09-15: 20 mg via INTRAVENOUS
  Filled 2013-09-15: qty 2

## 2013-09-15 MED ORDER — ALBUTEROL SULFATE (2.5 MG/3ML) 0.083% IN NEBU
2.5000 mg | INHALATION_SOLUTION | Freq: Three times a day (TID) | RESPIRATORY_TRACT | Status: DC
  Administered 2013-09-15 – 2013-09-17 (×7): 2.5 mg via RESPIRATORY_TRACT
  Filled 2013-09-15 (×8): qty 3

## 2013-09-15 NOTE — Evaluation (Signed)
Physical Therapy Evaluation Patient Details Name: Christopher Orozco MRN: [id number] DOB: November 17, 1940 Today's Date: 09/15/2013   History of Present Illness  Patient is a 73 y.o. male presenting with fall and shortness of breath.  Clinical Impression  Pt admitted with/for falls and sob.  Pt currently limited functionally due to the problems listed. ( See problems list.)   Pt will benefit from PT to maximize function and safety in order to get ready for next venue listed below.     Follow Up Recommendations SNF    Equipment Recommendations  None recommended by PT    Recommendations for Other Services       Precautions / Restrictions Precautions Precautions: Fall Restrictions Weight Bearing Restrictions: No      Mobility  Bed Mobility Overal bed mobility: Needs Assistance Bed Mobility: Supine to Sit     Supine to sit: Supervision (with rail)     General bed mobility comments: safe with use of rail  Transfers Overall transfer level: Needs assistance   Transfers: Sit to/from Stand;Stand Pivot Transfers Sit to Stand: Min assist;From elevated surface         General transfer comment: heavier assist for lower toilet  Ambulation/Gait Ambulation/Gait assistance: Min guard Ambulation Distance (Feet): 15 Feet (to bathroom and 25 across room to chair) Assistive device: Rolling walker (2 wheeled) Gait Pattern/deviations: Step-through pattern   Gait velocity interpretation: Below normal speed for age/gender General Gait Details: mildly unsteady gait with flexed posture  Stairs            Wheelchair Mobility    Modified Rankin (Stroke Patients Only)       Balance Overall balance assessment: No apparent balance deficits (not formally assessed)                                           Pertinent Vitals/Pain     Home Living Family/patient expects to be discharged to:: Private residence Living Arrangements: Alone Available Help at Discharge:  Available PRN/intermittently;Family Type of Home: Apartment Home Access: Stairs to enter   [employer] of Steps: ?couple Home Layout: One level Home Equipment: Environmental consultant - 4 wheels;Cane - single point;Shower seat;Wheelchair - manual;Electric scooter;Grab bars - tub/shower;Hospital bed      Prior Function Level of Independence: Independent with assistive device(s)               Hand Dominance        Extremity/Trunk Assessment               Lower Extremity Assessment: Generalized weakness         Communication   Communication: No difficulties  Cognition Arousal/Alertness: Awake/alert Behavior During Therapy: WFL for tasks assessed/performed Overall Cognitive Status: Within Functional Limits for tasks assessed                      General Comments      Exercises        Assessment/Plan    PT Assessment Patient needs continued PT services  PT Diagnosis Difficulty walking;Generalized weakness   PT Problem List Decreased strength;Decreased activity tolerance;Decreased balance;Decreased mobility;Decreased knowledge of use of DME;Cardiopulmonary status limiting activity  PT Treatment Interventions DME instruction;Gait training;Functional mobility training;Therapeutic activities;Patient/family education;Balance training   PT Goals (Current goals can be found in the Care Plan section) Acute Rehab PT Goals Patient Stated Goal: I (ultimately ) want to get home  PT Goal Formulation: With patient Time For Goal Achievement: 09/29/13 Potential to Achieve Goals: Good    Frequency Min 3X/week   Barriers to discharge Decreased caregiver support      Co-evaluation               End of Session Equipment Utilized During Treatment: Oxygen Activity Tolerance: Patient tolerated treatment well;Patient limited by fatigue Patient left: in chair;with call bell/phone within reach Nurse Communication: Mobility status         Time: 5361-4431 PT  Time Calculation (min): 37 min   Charges:   PT Evaluation $Initial PT Evaluation Tier I: 1 Procedure PT Treatments $Gait Training: 8-22 mins $Therapeutic Activity: 8-22 mins   PT G CodesTessie Fass Meili Kleckley 09/15/2013, 5:51 PM 09/15/2013  Donnella Sham, PT [phone]  (pager)

## 2013-09-15 NOTE — Progress Notes (Addendum)
PATIENT DETAILS Name: Christopher Orozco Age: 73 y.o. Sex: male Date of Birth: [birth year] Admit Date: 09/14/2013 Admitting Physician Cristal Ford, DO PCP:No primary provider on file.  Subjective: Mild improvement in SOB.  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 Likely secondary to acute COPD exacerbation - Continue with oxygen-uses 4 L of oxygen at home at all times.  COPD exacerbation (history of  COPD Gold Stage D) - Diminished air entry everywhere but is moving air. Only scattered rhonchi. - Continue DuoNeb, levofloxacin and slowly taper prednisone.  Falls at home - Nonfocal exam. Left knee x-ray confirms small nondisplaced left fibular neck fracture. Spoke to Dr. Inda Merlin, nonsurgical, okay to walk around with a walker. Followup in his office. - PT eval, suspect we'll need SNF on discharge.  Left fibular neck fracture -Left knee x-ray confirms small nondisplaced left fibular neck fracture. Spoke to Dr. Lorin Mercy, nonsurgical, okay to walk around with a walker. Followup in his office.  HTN - Controlled, continue Coreg.  Dyslipidemia - Continue with statins.  Gastroesophageal reflux disease - Continue PPI.  History of CAD - Stable, no chest pain. Continue aspirin, beta blocker and statins.  History of restless leg syndrome - Continue Requip  Disposition: Remain inpatient  DVT Prophylaxis: Prophylactic  Heparin   Code Status: Full code   Family Communication None at bedside  Procedures:  None  CONSULTS:  None  Time spent 40 minutes-which includes 50% of the time with face-to-face with patient/ family and coordinating care related to the above assessment and plan.    MEDICATIONS: Scheduled Meds: . albuterol  2.5 mg Nebulization TID  . antiseptic oral rinse  15 mL Mouth Rinse BID  . aspirin  81 mg Oral Daily  . budesonide-formoterol  2 puff Inhalation BID  . calcium carbonate   1 tablet Oral BID WC  . carvedilol  12.5 mg Oral BID WC  . docusate sodium  100 mg Oral BID  . furosemide  20 mg Intravenous Once  . heparin  5,000 Units Subcutaneous 3 times per day  . pantoprazole  40 mg Oral Daily  . polyethylene glycol  17 g Oral Daily  . predniSONE  60 mg Oral Q breakfast  . rOPINIRole  2 mg Oral QHS  . tiotropium  18 mcg Inhalation Daily  . traZODone  100 mg Oral QHS   Continuous Infusions:  PRN Meds:.albuterol, HYDROcodone-acetaminophen, nicotine polacrilex  Antibiotics: Anti-infectives   Start     Dose/Rate Route Frequency Ordered Stop   09/14/13 1300  levofloxacin (LEVAQUIN) tablet 750 mg     750 mg Oral  Once 09/14/13 1249 09/14/13 1252       PHYSICAL EXAM: Vital signs in last 24 hours: Filed Vitals:   09/15/13 0529 09/15/13 0804 09/15/13 0822 09/15/13 0944  BP: 134/78 151/79    Pulse: 77 82    Temp: 97.6 F (36.4 C)     TempSrc: Oral     Resp: 16     Height:      Weight: 106.187 kg (234 lb 1.6 oz)   83.416 kg (183 lb 14.4 oz)  SpO2: 95%  94%     Weight change:  Filed Weights   09/14/13 1549 09/15/13 0529 09/15/13 0944  Weight: 106.096 kg (233 lb 14.4 oz) 106.187 kg (234 lb 1.6 oz) 83.416 kg (183 lb 14.4 oz)   Body mass index is 28.8 kg/(m^2).   Gen Exam: Awake and alert with  clear speech.   Neck: Supple, No JVD.   Chest: Diminished air entry bilaterally, only scattered rhonchi  CVS: S1 S2 Regular, no murmurs.  Abdomen: soft, BS +, non tender, non distended.  Extremities: no edema, lower extremities warm to touch. Neurologic: Non Focal.   Skin: No Rash.   Wounds: N/A.   Intake/Output from previous day:  Intake/Output Summary (Last 24 hours) at 09/15/13 1028 Last data filed at 09/14/13 2200  Gross per 24 hour  Intake     60 ml  Output    120 ml  Net    -60 ml     LAB RESULTS: CBC  Recent Labs Lab 09/14/13 1010 09/14/13 1617 09/15/13 0610  WBC 17.7* 16.6* 15.6*  HGB 11.4* 10.9* 10.2*  HCT 34.8* 33.5* 31.5*  PLT  PLATELET CLUMPS NOTED ON SMEAR, COUNT APPEARS ADEQUATE 141* 168  MCV 98.0 95.7 95.2  MCH 32.1 31.1 30.8  MCHC 32.8 32.5 32.4  RDW 12.8 13.1 13.1  LYMPHSABS 1.1  --   --   MONOABS 0.4  --   --   EOSABS 0.2  --   --   BASOSABS 0.0  --   --     Chemistries   Recent Labs Lab 09/14/13 1010 09/14/13 1617 09/15/13 0610  NA 139  --  140  K 4.0  --  3.9  CL 98  --  98  CO2 26  --  30  GLUCOSE 125*  --  141*  BUN 10  --  14  CREATININE 0.60 0.52 0.52  CALCIUM 9.4  --  9.4    CBG: No results found for this basename: GLUCAP,  in the last 168 hours  GFR Estimated Creatinine Clearance: 84.9 ml/min (by C-G formula based on Cr of 0.52).  Coagulation profile  Recent Labs Lab 09/15/13 0610  INR 1.01    Cardiac Enzymes  Recent Labs Lab 09/14/13 1010  TROPONINI <0.30    No components found with this basename: POCBNP,  No results found for this basename: DDIMER,  in the last 72 hours No results found for this basename: HGBA1C,  in the last 72 hours No results found for this basename: CHOL, HDL, LDLCALC, TRIG, CHOLHDL, LDLDIRECT,  in the last 72 hours  Recent Labs  09/14/13 1620  TSH 0.157*   No results found for this basename: VITAMINB12, FOLATE, FERRITIN, TIBC, IRON, RETICCTPCT,  in the last 72 hours No results found for this basename: LIPASE, AMYLASE,  in the last 72 hours  Urine Studies No results found for this basename: UACOL, UAPR, USPG, UPH, UTP, UGL, UKET, UBIL, UHGB, UNIT, UROB, ULEU, UEPI, UWBC, URBC, UBAC, CAST, CRYS, UCOM, BILUA,  in the last 72 hours  MICROBIOLOGY: No results found for this or any previous visit (from the past 240 hour(s)).  RADIOLOGY STUDIES/RESULTS: Dg Chest 2 View  09/14/2013   CLINICAL DATA:  Shortness of breath.  EXAM: CHEST  2 VIEW  COMPARISON:  None.  FINDINGS: There is hyperinflation of the lungs compatible with COPD. No confluent opacities or effusions. Heart is normal size. Mild peribronchial thickening and interstitial  prominence, likely bronchitis.  IMPRESSION: COPD/ bronchitic changes.   Electronically Signed   By: Rolm Baptise M.D.   On: 09/14/2013 11:11   Dg Hip Complete Left  09/14/2013   CLINICAL DATA:  Fall, pain.  EXAM: LEFT HIP - COMPLETE 2+ VIEW  COMPARISON:  None.  FINDINGS: Mild symmetric joint space narrowing within the hip joints bilaterally. No acute bony abnormality.  Specifically, no fracture, subluxation, or dislocation. Soft tissues are intact. Radiation seeds in the region of the prostate.  IMPRESSION: No acute bony abnormality.   Electronically Signed   By: Rolm Baptise M.D.   On: 09/14/2013 10:53   Dg Ankle Complete Left  09/14/2013   CLINICAL DATA:  Left ankle pain  EXAM: LEFT ANKLE COMPLETE - 3+ VIEW  COMPARISON:  None.  FINDINGS: There is no evidence of fracture, dislocation, or joint effusion. There is no evidence of arthropathy or other focal bone abnormality. Soft tissues are unremarkable.  IMPRESSION: No acute abnormality is noted.   Electronically Signed   By: Inez Catalina M.D.   On: 09/14/2013 21:58   Dg Knee 4 Views W/patella Left  09/14/2013   CLINICAL DATA:  Traumatic injury and pain  EXAM: LEFT KNEE - COMPLETE 4+ VIEW  COMPARISON:  None.  FINDINGS: Mild irregularity of the fibular neck is noted which may represent displaced fracture. No other fractures are seen. No joint effusion or soft tissue abnormality is noted.  IMPRESSION: Possible undisplaced fibular neck fracture.   Electronically Signed   By: Inez Catalina M.D.   On: 09/14/2013 22:42    Shanker Kristeen Mans, MD  [facility] Pager:[phone]  If 7PM-7AM, please contact night-coverage [email or web address] Password [email or web address] 09/15/2013, 10:28 AM   LOS: 1 day   **Disclaimer: This note may have been dictated with voice recognition software. Similar sounding words can inadvertently be transcribed and this note may contain transcription errors which may not have been corrected upon publication of note.**

## 2013-09-15 NOTE — Care Management Note (Signed)
    Page 1 of 1   09/17/2013     5:28:00 PM CARE MANAGEMENT NOTE 09/17/2013  Patient:  KEY, CEN   Account Number:  [id number]  Date Initiated:  09/15/2013  Documentation initiated by:  Tomi Bamberger  Subjective/Objective Assessment:   dx copd, fall  admit- lives alone.     Action/Plan:   pt eval-rec snf   Anticipated DC Date:  09/17/2013   Anticipated DC Plan:  SKILLED NURSING FACILITY  In-house referral  Clinical Social Worker      DC Planning Services  CM consult      Choice offered to / List presented to:             Status of service:  Completed, signed off Medicare Important Message given?   (If response is "NO", the following Medicare IM given date fields will be blank) Date Medicare IM given:   Date Additional Medicare IM given:  09/17/2013  Discharge Disposition:  [place]  Per UR Regulation:  Reviewed for med. necessity/level of care/duration of stay  If discussed at [place] of Stay Meetings, dates discussed:    Comments:

## 2013-09-16 DIAGNOSIS — J440 Chronic obstructive pulmonary disease with acute lower respiratory infection: Principal | ICD-10-CM

## 2013-09-16 DIAGNOSIS — J449 Chronic obstructive pulmonary disease, unspecified: Secondary | ICD-10-CM

## 2013-09-16 DIAGNOSIS — M129 Arthropathy, unspecified: Secondary | ICD-10-CM

## 2013-09-16 DIAGNOSIS — E7800 Pure hypercholesterolemia, unspecified: Secondary | ICD-10-CM

## 2013-09-16 DIAGNOSIS — G8929 Other chronic pain: Secondary | ICD-10-CM

## 2013-09-16 LAB — T3: T3, Total: 81.7 ng/dl (ref 80.0–204.0)

## 2013-09-16 LAB — T4, FREE: Free T4: 1.13 ng/dL (ref 0.80–1.80)

## 2013-09-16 MED ORDER — LEVOFLOXACIN IN D5W 750 MG/150ML IV SOLN
750.0000 mg | INTRAVENOUS | Status: DC
  Administered 2013-09-16: 750 mg via INTRAVENOUS
  Filled 2013-09-16 (×2): qty 150

## 2013-09-16 MED ORDER — FUROSEMIDE 10 MG/ML IJ SOLN
40.0000 mg | Freq: Once | INTRAMUSCULAR | Status: AC
  Administered 2013-09-16: 40 mg via INTRAVENOUS
  Filled 2013-09-16: qty 4

## 2013-09-16 NOTE — Progress Notes (Signed)
Chaplain received referral for advance directive from [place] to meet pt and his daughter at 4:15. Pt's daughter had not yet arrived. Chaplain left documents with pt for them to review together and directed him to request chaplain or CSW when ready to complete. Please page for follow up.   Ethelene Browns [phone]

## 2013-09-16 NOTE — Progress Notes (Signed)
PATIENT DETAILS Name: Christopher Orozco Age: 73 y.o. Sex: male Date of Birth: 11-30-1940 Admit Date: 09/14/2013 Admitting Physician Cristal Ford, DO PCP:No primary provider on file.  Subjective: Feels better, denies any complaints. 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 Likely secondary to acute COPD exacerbation - Continue with oxygen-uses 4 L of oxygen at home at all times.  COPD exacerbation (history of  COPD Gold Stage D) - Diminished air entry everywhere but is moving air. Only scattered rhonchi. - Continue DuoNeb, levofloxacin and slowly taper prednisone.  Falls at home - Nonfocal exam. Left knee x-ray confirms small nondisplaced left fibular neck fracture. Spoke to Dr. Inda Merlin, nonsurgical, okay to walk around with a walker. Followup in his office. - PT eval, suspect we'll need SNF on discharge.  Left fibular neck fracture -Left knee x-ray confirms small nondisplaced left fibular neck fracture. Spoke to Dr. Inda Merlin, nonsurgical, okay to walk around with a walker. Followup in his office.  HTN - Controlled, continue Coreg.  Dyslipidemia - Continue with statins.  Gastroesophageal reflux disease - Continue PPI.  History of CAD - Stable, no chest pain. Continue aspirin, beta blocker and statins.  History of restless leg syndrome - Continue Requip  Disposition: Remain inpatient  DVT Prophylaxis: Prophylactic  Heparin   Code Status: Full code   Family Communication None at bedside  Procedures:  None  CONSULTS:  None  Time spent 40 minutes-which includes 50% of the time with face-to-face with patient/ family and coordinating care related to the above assessment and plan.    MEDICATIONS: Scheduled Meds: . albuterol  2.5 mg Nebulization TID  . antiseptic oral rinse  15 mL Mouth Rinse BID  . aspirin  81 mg  Oral Daily  . budesonide-formoterol  2 puff Inhalation BID  . calcium carbonate  1 tablet Oral BID WC  . carvedilol  12.5 mg Oral BID WC  . docusate sodium  100 mg Oral BID  . levofloxacin (LEVAQUIN) IV  750 mg Intravenous Q24H  . pantoprazole  40 mg Oral Daily  . polyethylene glycol  17 g Oral Daily  . predniSONE  60 mg Oral Q breakfast  . rOPINIRole  2 mg Oral QHS  . simvastatin  40 mg Oral Daily  . tiotropium  18 mcg Inhalation Daily  . traZODone  100 mg Oral QHS   Continuous Infusions:  PRN Meds:.albuterol, HYDROcodone-acetaminophen, nicotine polacrilex  Antibiotics: Anti-infectives   Start     Dose/Rate Route Frequency Ordered Stop   09/16/13 1200  levofloxacin (LEVAQUIN) IVPB 750 mg     750 mg 100 mL/hr over 90 Minutes Intravenous Every 24 hours 09/16/13 1041     09/14/13 1300  levofloxacin (LEVAQUIN) tablet 750 mg     750 mg Oral  Once 09/14/13 1249 09/14/13 1252       PHYSICAL EXAM: Vital signs in last 24 hours: Filed Vitals:   09/15/13 2109 09/16/13 0442 09/16/13 0735 09/16/13 0808  BP:  105/60  114/73  Pulse:  84  79  Temp:  97.7 F (36.5 C)    TempSrc:  Oral    Resp:  16    Height:      Weight:  105.144 kg (231 lb 12.8 oz)  83.28 kg (183 lb 9.6 oz)  SpO2: 96% 97% 94%     Weight change: -22.68 kg (-50 lb)  Filed Weights   09/15/13 0944 09/16/13 0442 09/16/13 0808  Weight: 83.416 kg (183 lb 14.4 oz) 105.144 kg (231 lb 12.8 oz) 83.28 kg (183 lb 9.6 oz)   Body mass index is 28.75 kg/(m^2).   Gen Exam: Awake and alert with clear speech.   Neck: Supple, No JVD.   Chest: Diminished air entry bilaterally, only scattered rhonchi  CVS: S1 S2 Regular, no murmurs.  Abdomen: soft, BS +, non tender, non distended.  Extremities: no edema, lower extremities warm to touch. Neurologic: Non Focal.   Skin: No Rash.   Wounds: N/A.   Intake/Output from previous day:  Intake/Output Summary (Last 24 hours) at 09/16/13 1200 Last data filed at 09/16/13 0444  Gross per  24 hour  Intake      0 ml  Output    300 ml  Net   -300 ml     LAB RESULTS: CBC  Recent Labs Lab 09/14/13 1010 09/14/13 1617 09/15/13 0610  WBC 17.7* 16.6* 15.6*  HGB 11.4* 10.9* 10.2*  HCT 34.8* 33.5* 31.5*  PLT PLATELET CLUMPS NOTED ON SMEAR, COUNT APPEARS ADEQUATE 141* 168  MCV 98.0 95.7 95.2  MCH 32.1 31.1 30.8  MCHC 32.8 32.5 32.4  RDW 12.8 13.1 13.1  LYMPHSABS 1.1  --   --   MONOABS 0.4  --   --   EOSABS 0.2  --   --   BASOSABS 0.0  --   --     Chemistries   Recent Labs Lab 09/14/13 1010 09/14/13 1617 09/15/13 0610  NA 139  --  140  K 4.0  --  3.9  CL 98  --  98  CO2 26  --  30  GLUCOSE 125*  --  141*  BUN 10  --  14  CREATININE 0.60 0.52 0.52  CALCIUM 9.4  --  9.4    CBG: No results found for this basename: GLUCAP,  in the last 168 hours  GFR Estimated Creatinine Clearance: 84.9 ml/min (by C-G formula based on Cr of 0.52).  Coagulation profile  Recent Labs Lab 09/15/13 0610  INR 1.01    Cardiac Enzymes  Recent Labs Lab 09/14/13 1010  TROPONINI <0.30    No components found with this basename: POCBNP,  No results found for this basename: DDIMER,  in the last 72 hours No results found for this basename: HGBA1C,  in the last 72 hours No results found for this basename: CHOL, HDL, LDLCALC, TRIG, CHOLHDL, LDLDIRECT,  in the last 72 hours  Recent Labs  09/14/13 1620  TSH 0.157*   No results found for this basename: VITAMINB12, FOLATE, FERRITIN, TIBC, IRON, RETICCTPCT,  in the last 72 hours No results found for this basename: LIPASE, AMYLASE,  in the last 72 hours  Urine Studies No results found for this basename: UACOL, UAPR, USPG, UPH, UTP, UGL, UKET, UBIL, UHGB, UNIT, UROB, ULEU, UEPI, UWBC, URBC, UBAC, CAST, CRYS, UCOM, BILUA,  in the last 72 hours  MICROBIOLOGY: No results found for this or any previous visit (from the past 240 hour(s)).  RADIOLOGY STUDIES/RESULTS: Dg Chest 2 View  09/14/2013   CLINICAL DATA:  Shortness of  breath.  EXAM: CHEST  2 VIEW  COMPARISON:  None.  FINDINGS: There is hyperinflation of the lungs compatible with COPD. No confluent opacities or effusions. Heart is normal size. Mild peribronchial thickening and interstitial prominence, likely bronchitis.  IMPRESSION: COPD/ bronchitic changes.   Electronically Signed   By: Rolm Baptise M.D.  On: 09/14/2013 11:11   Dg Hip Complete Left  09/14/2013   CLINICAL DATA:  Fall, pain.  EXAM: LEFT HIP - COMPLETE 2+ VIEW  COMPARISON:  None.  FINDINGS: Mild symmetric joint space narrowing within the hip joints bilaterally. No acute bony abnormality. Specifically, no fracture, subluxation, or dislocation. Soft tissues are intact. Radiation seeds in the region of the prostate.  IMPRESSION: No acute bony abnormality.   Electronically Signed   By: Rolm Baptise M.D.   On: 09/14/2013 10:53   Dg Ankle Complete Left  09/14/2013   CLINICAL DATA:  Left ankle pain  EXAM: LEFT ANKLE COMPLETE - 3+ VIEW  COMPARISON:  None.  FINDINGS: There is no evidence of fracture, dislocation, or joint effusion. There is no evidence of arthropathy or other focal bone abnormality. Soft tissues are unremarkable.  IMPRESSION: No acute abnormality is noted.   Electronically Signed   By: Inez Catalina M.D.   On: 09/14/2013 21:58   Dg Knee 4 Views W/patella Left  09/14/2013   CLINICAL DATA:  Traumatic injury and pain  EXAM: LEFT KNEE - COMPLETE 4+ VIEW  COMPARISON:  None.  FINDINGS: Mild irregularity of the fibular neck is noted which may represent displaced fracture. No other fractures are seen. No joint effusion or soft tissue abnormality is noted.  IMPRESSION: Possible undisplaced fibular neck fracture.   Electronically Signed   By: Inez Catalina M.D.   On: 09/14/2013 22:42    Verlee Monte, MD  [facility] Pager:[phone]  If 7PM-7AM, please contact night-coverage [email or web address] Password [email or web address] 09/16/2013, 12:00 PM   LOS: 2 days   **Disclaimer: This note may have been dictated  with voice recognition software. Similar sounding words can inadvertently be transcribed and this note may contain transcription errors which may not have been corrected upon publication of note.**

## 2013-09-16 NOTE — Clinical Social Work Note (Signed)
Clinical Social Work Department BRIEF PSYCHOSOCIAL ASSESSMENT 09/16/2013  Patient:  Christopher Orozco, Christopher Orozco     Account Number:  [id number]     Admit date:  09/14/2013  Clinical Social Worker:  Myles Lipps  Date/Time:  09/16/2013 03:00 PM  Referred by:  Care Management  Date Referred:  09/16/2013 Referred for  SNF Placement   Other Referral:   Interview type:  Patient Other interview type:   Patient requested to call his daughter, Margaretmary Eddy, regarding placement options - spoke with daughter over the phone    PSYCHOSOCIAL DATA Living Status:  ALONE Admitted from facility:   Level of care:   Primary support name:  Margaretmary Eddy  [phone] Primary support relationship to patient:  CHILD, ADULT Degree of support available:   Strong    CURRENT CONCERNS Current Concerns  Post-Acute Placement   Other Concerns:    SOCIAL WORK ASSESSMENT / PLAN Clinical Social Worker met with patient at bedside to offer support and discuss patient needs at discharge.  Patient states that he lives at home alone and his daughter lives down the street.  Patient daughter is a Therapist, sports at [employer] and is limited to phone conversations due to work schedule.  CSW spoke with patient at bedside and patient daughter over the phone who both are agreeable to SNF placement in [facility] area.  CSW has completed FL2 and initiated SNF search to the facilities surrounding the [facility] area.    Clinical Social Worker remains available for support and to facilitate patient discharge needs once medically ready for discharge.   Assessment/plan status:  Psychosocial Support/Ongoing Assessment of Needs Other assessment/ plan:   Information/referral to community resources:   Patient daughter requested that patient update his living will to reflect DNR status - CSW spoke with Pastoral Care who will have a Chaplain present with patient and daughter to update.  Will likely need a notary 05/14    PATIENT'S/FAMILY'S  RESPONSE TO PLAN OF CARE: Patient alert and oriented x3 laying in bed.  Patient very pleasant and engaged in assessment process.  Patient is realistic about his inability to return home alone and agreeable to SNF placement in [facility] area.  Patient with good family support and patient daughter with understanding of patient ability to file SNF placement under Medicare benefit vs. [facility] contract.  Patient daughter verbalized her appreciation for CSW support and concern.

## 2013-09-16 NOTE — Clinical Social Work Placement (Addendum)
Clinical Social Work Department CLINICAL SOCIAL WORK PLACEMENT NOTE 09/16/2013  Patient:  LENNIN, OSMOND  Account Number:  [id number] Admit date:  09/14/2013  Clinical Social Worker:  Barbette Or, LCSW  Date/time:  09/16/2013 03:00 PM  Clinical Social Work is seeking post-discharge placement for this patient at the following level of care:   [place]   (*CSW will update this form in Epic as items are completed)   09/16/2013  Patient/family provided with [place] Department of Clinical Social Work's list of facilities offering this level of care within the geographic area requested by the patient (or if unable, by the patient's family).  09/16/2013  Patient/family informed of their freedom to choose among providers that offer the needed level of care, that participate in Medicare, Medicaid or managed care program needed by the patient, have an available bed and are willing to accept the patient.  09/16/2013  Patient/family informed of [facility]' ownership interest in [facility], as well as of the fact that they are under no obligation to receive care at this facility.  PASARR submitted to EDS on 09/16/2013 PASARR number received from EDS on 09/16/2013  FL2 transmitted to all facilities in geographic area requested by pt/family on  09/16/2013 FL2 transmitted to all facilities within larger geographic area on   Patient informed that his/her managed care company has contracts with or will negotiate with  certain facilities, including the following:     Patient/family informed of bed offers received:  09/17/2013 Patient chooses bed at [facility] Physician recommends and patient chooses bed at    Patient to be transferred to  [facility] on  09/17/13 Patient to be transferred to facility by [facility]  The following physician request were entered in Epic:   Additional Comments:

## 2013-09-17 LAB — BASIC METABOLIC PANEL
BUN: 16 mg/dL (ref 6–23)
CO2: 36 mEq/L — ABNORMAL HIGH (ref 19–32)
CREATININE: 0.64 mg/dL (ref 0.50–1.35)
Calcium: 9.7 mg/dL (ref 8.4–10.5)
Chloride: 97 mEq/L (ref 96–112)
GFR calc Af Amer: >90 mL/min (ref >90)
GFR calc non Af Amer: >90 mL/min (ref >90)
GLUCOSE: 156 mg/dL — AB (ref 70–99)
Potassium: 4.3 mEq/L (ref 3.7–5.3)
Sodium: 142 mEq/L (ref 137–147)

## 2013-09-17 MED ORDER — HYDROCODONE-ACETAMINOPHEN 10-325 MG PO TABS: 1.0000 | ORAL_TABLET | Freq: Four times a day (QID) | ORAL | Status: AC | PRN

## 2013-09-17 MED ORDER — LEVOFLOXACIN 750 MG PO TABS: 750.0000 mg | ORAL_TABLET | Freq: Every day | ORAL | Status: AC

## 2013-09-17 MED ORDER — PREDNISONE 10 MG PO TABS: ORAL_TABLET | ORAL | Status: AC

## 2013-09-17 NOTE — Progress Notes (Signed)
Christopher Orozco discharged Skilled nursing facility per MD order.  Attempted to call report  to receiving nurse at [facility].   RN left number for return call, will await for return call.    Medication List         albuterol 108 (90 BASE) MCG/ACT inhaler  Commonly known as:  PROVENTIL HFA;VENTOLIN HFA  Inhale 2 puffs into the lungs every 4 (four) hours.     aspirin 81 MG tablet  Take 81 mg by mouth daily.     budesonide-formoterol 160-4.5 MCG/ACT inhaler  Commonly known as:  SYMBICORT  Inhale 2 puffs into the lungs 2 (two) times daily.     calcium carbonate 600 MG Tabs tablet  Commonly known as:  OS-CAL  Take 600 mg by mouth 2 (two) times daily with a meal.     carvedilol 12.5 MG tablet  Commonly known as:  COREG  Take 12.5 mg by mouth 2 (two) times daily with a meal.     docusate sodium 100 MG capsule  Commonly known as:  COLACE  Take 100 mg by mouth 2 (two) times daily.     HYDROcodone-acetaminophen 10-325 MG per tablet  Commonly known as:  NORCO  Take 1 tablet by mouth every 6 (six) hours as needed.     levofloxacin 750 MG tablet  Commonly known as:  LEVAQUIN  Take 1 tablet (750 mg total) by mouth daily.     nicotine polacrilex 4 MG lozenge  Commonly known as:  COMMIT  Take 2 mg by mouth daily as needed for smoking cessation.     omeprazole 20 MG capsule  Commonly known as:  PRILOSEC  Take 20 mg by mouth daily.     polyethylene glycol packet  Commonly known as:  MIRALAX / GLYCOLAX  Take 17 g by mouth daily.     predniSONE 1 MG tablet  Commonly known as:  DELTASONE  Take 1 mg by mouth daily with breakfast. Taper: 10 mg x 10 days, 9 mg x 10 days, 8 mg x 10 days, 7 mg x 10 days, 6 mg x 10 days, 5 mg x 10 days, 4 mg x 10 days, 3 mg x 10 days, 2 mg x 10 days, 1 mg x 10 days     predniSONE 10 MG tablet  Commonly known as:  DELTASONE  Take 4 tabs by mouth daily for 2 days, then 3 tabs by mouth daily for 2 days, then 2 tabs by mouth daily for 2 days, than 1 tab by  mouth daily, then restart the previous prolonged prednisone taper.     rOPINIRole 2 MG tablet  Commonly known as:  REQUIP  Take 2 mg by mouth at bedtime as needed (for restless leg syndrome).     simvastatin 40 MG tablet  Commonly known as:  ZOCOR  Take 40 mg by mouth daily.     tiotropium 18 MCG inhalation capsule  Commonly known as:  SPIRIVA  Place 18 mcg into inhaler and inhale daily.     traZODone 100 MG tablet  Commonly known as:  DESYREL  Take 100 mg by mouth at bedtime.        Patients skin is clean, dry and intact, no evidence of skin break down. IV site discontinued and catheter remains intact. Site without signs and symptoms of complications. Dressing and pressure applied.  Patient transported via W/C to daughter's car, who is transporting pt. To [name], no distress noted upon discharge.  [name] C [name] 09/17/2013  6:38 PM

## 2013-09-17 NOTE — Progress Notes (Signed)
CSW (Clinical Education officer, museum) prepared pt dc packet and placed with shadow chart. CSW confirmed with pt daughter that she will provide transport. Pt, pt nurse, and facility informed. CSW signing off.  [place], [place]

## 2013-09-17 NOTE — Discharge Summary (Addendum)
Physician Discharge Summary  Christopher Orozco [id number] DOB: 24-Nov-1940 DOA: 09/14/2013  PCP: No primary provider on file.  Admit date: 09/14/2013 Discharge date: 09/17/2013  Time spent: 40 minutes  Recommendations for Outpatient Follow-up:  1. Followup with the nursing home M.D. 2. Please follow the prednisone taper carefully. Levofloxacin for 5 more days. 3. Followup with Dr. Lorin Mercy for left fibular neck fracture.  Discharge Diagnoses:  Active Problems:   COPD (chronic obstructive pulmonary disease)   Failure to thrive   Fall at home   Hypertension   Elevated cholesterol   Chronic pain   Multiple thyroid nodules   Neurogenic bladder   Arthritis   CAD (coronary artery disease)   COPD (chronic obstructive pulmonary disease) with acute bronchitis   Discharge Condition: Stable  Diet recommendation: Heart healthy  Filed Weights   09/16/13 0442 09/16/13 0808 09/17/13 0457  Weight: 105.144 kg (231 lb 12.8 oz) 83.28 kg (183 lb 9.6 oz) 83 kg (182 lb 15.7 oz)    History of present illness:  73 y/o ? CAd 1996-had NSTEMI, COPD, Gold stage 'D' on chronic home Oxygen at 3.5-4 L for at least past 3 yr, Ex-smoker till June 2014 [smoked ~ 2ppd], Rh arthritis [used to be on steroids-tapering now slowly per pulm], Restless leg syndrome, Bladder issues-TURP with incontinence-suppsoed tio Intermittently cath, Degen disc diseaserecent transplant from [place] with 1-2 /7 h/o ? weakness and sob, feel this am on going to Rr . Couldn't get up--sats were 70-72% on 3 liters. Has had ~ 40 lb weight gain, with ? abd girth as well as Le swelling and has been told to wear compression hose which he is non compliant with. He has had no PND and usually uses 3-4 pills to sleep upright at night and has been sleeping on the couch otherwise not currently has a hospital bed  He has had no specific lower extremity swelling  He has had no chest pain no nausea no vomiting no sick contacts no ill contacts no chills no  dysuria no diarrhea no productive cough.  He fell down and seems to follow him on his left ankle as well as his left knee and thinks that he twisted his ankle.  He did not have any dizziness or any pre-syncopalhe just felt weak overall and did not have any spinning of his head or presyncopal episodes  He's never had a stroke or seizure  Used to be more active-Has been declining and [facility] been asking about routine screening such as AAA screening, colonoscopy and his daughter [ who is an Therapist, sports with [employer] ] has had discussions with PCP at [place] regarding Code status as well as other concerns.   Hospital Course:   Acute on chronic hypoxic/hypercarbic respiratory failure  - Back to baseline. Likely secondary to acute COPD exacerbation. -Had 2 doses of Lasix, please reevaluate the need of diuretics if he develops lower extremity edema. - Continue with oxygen-uses 4 L of oxygen at home at all times.   COPD exacerbation (history of COPD Gold Stage D)  - Diminished air entry everywhere but is moving air. Only scattered ronchi.  - Continue DuoNeb, levofloxacin for 5 more days and slowly taper prednisone.   Chronic and prolonged steroid use -Patient being on prednisone for 6-7 years, has been on recent prolonged taper. 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 -Discharged on 40 mg of prednisone to taper it quickly to his previous dose of 10. -Than start the  prolonged taper, which is being prescribed before.  Falls at home  - Nonfocal exam. Left knee x-ray confirms small nondisplaced left fibular neck fracture. Spoke to Dr. Lorin Mercy, nonsurgical, okay to walk around with a walker. Followup in his office.  - PT eval, patient is on discharge is noted.  Left fibular neck fracture  -Left knee x-ray confirms small nondisplaced left fibular neck fracture. Spoke to Dr. Lorin Mercy, nonsurgical, okay to walk around with a walker. Followup in his office.   -Evaluated by PT, recommend a skilled nursing facility.  HTN  - Controlled, continue Coreg.   Dyslipidemia  - Continue with statins.   Gastroesophageal reflux disease  - Continue PPI.   History of CAD  - Stable, no chest pain. Continue aspirin, beta blocker and statins.   History of restless leg syndrome  - Continue Requip    Procedures:  None  Consultations:  None  Discharge Exam: Filed Vitals:   09/17/13 0813  BP: 140/68  Pulse: 62  Temp:   Resp:    General: Alert and awake, oriented x3, not in any acute distress. HEENT: anicteric sclera, pupils reactive to light and accommodation, EOMI CVS: S1-S2 clear, no murmur rubs or gallops Chest: clear to auscultation bilaterally, no wheezing, rales or rhonchi Abdomen: soft nontender, nondistended, normal bowel sounds, no organomegaly Extremities: no cyanosis, clubbing or edema noted bilaterally Neuro: Cranial nerves II-XII intact, no focal neurological deficits  Discharge Instructions You were cared for by a hospitalist during your hospital stay. If you have any questions about your discharge medications or the care you received while you were in the hospital after you are discharged, you can call the unit and asked to speak with the hospitalist on call if the hospitalist that took care of you is not available. Once you are discharged, your primary care physician will handle any further medical issues. Please note that NO REFILLS for any discharge medications will be authorized once you are discharged, as it is imperative that you return to your primary care physician (or establish a relationship with a primary care physician if you do not have one) for your aftercare needs so that they can reassess your need for medications and monitor your lab values.  Discharge Orders   Future Orders Complete By Expires   Diet - low sodium heart healthy  As directed    Increase activity slowly  As directed        Medication List          albuterol 108 (90 BASE) MCG/ACT inhaler  Commonly known as:  PROVENTIL HFA;VENTOLIN HFA  Inhale 2 puffs into the lungs every 4 (four) hours.     aspirin 81 MG tablet  Take 81 mg by mouth daily.     budesonide-formoterol 160-4.5 MCG/ACT inhaler  Commonly known as:  SYMBICORT  Inhale 2 puffs into the lungs 2 (two) times daily.     calcium carbonate 600 MG Tabs tablet  Commonly known as:  OS-CAL  Take 600 mg by mouth 2 (two) times daily with a meal.     carvedilol 12.5 MG tablet  Commonly known as:  COREG  Take 12.5 mg by mouth 2 (two) times daily with a meal.     docusate sodium 100 MG capsule  Commonly known as:  COLACE  Take 100 mg by mouth 2 (two) times daily.     HYDROcodone-acetaminophen 10-325 MG per tablet  Commonly known as:  NORCO  Take 1 tablet by mouth every 6 (six) hours  as needed.     levofloxacin 750 MG tablet  Commonly known as:  LEVAQUIN  Take 1 tablet (750 mg total) by mouth daily.     nicotine polacrilex 4 MG lozenge  Commonly known as:  COMMIT  Take 2 mg by mouth daily as needed for smoking cessation.     omeprazole 20 MG capsule  Commonly known as:  PRILOSEC  Take 20 mg by mouth daily.     polyethylene glycol packet  Commonly known as:  MIRALAX / GLYCOLAX  Take 17 g by mouth daily.     predniSONE 1 MG tablet  Commonly known as:  DELTASONE  Take 1 mg by mouth daily with breakfast. Taper: 10 mg x 10 days, 9 mg x 10 days, 8 mg x 10 days, 7 mg x 10 days, 6 mg x 10 days, 5 mg x 10 days, 4 mg x 10 days, 3 mg x 10 days, 2 mg x 10 days, 1 mg x 10 days     predniSONE 10 MG tablet  Commonly known as:  DELTASONE  Take 4 tabs by mouth daily for 2 days, then 3 tabs by mouth daily for 2 days, then 2 tabs by mouth daily for 2 days, than 1 tab by mouth daily, then restart the previous prolonged prednisone taper.     rOPINIRole 2 MG tablet  Commonly known as:  REQUIP  Take 2 mg by mouth at bedtime as needed (for restless leg syndrome).     simvastatin  40 MG tablet  Commonly known as:  ZOCOR  Take 40 mg by mouth daily.     tiotropium 18 MCG inhalation capsule  Commonly known as:  SPIRIVA  Place 18 mcg into inhaler and inhale daily.     traZODone 100 MG tablet  Commonly known as:  DESYREL  Take 100 mg by mouth at bedtime.       Allergies  Allergen Reactions  . Bupropion     disorientation  . Gabapentin     disorientation  . Ultram [Tramadol] Other (See Comments)    disorientation       Follow-up Information   Follow up with YATES,MARK C, MD. Schedule an appointment as soon as possible for a visit in 2 weeks. (FOR LEFT FIBULAR NECK FRACTURE)    Specialty:  Orthopedic Surgery   Contact information:   [place] [address] [phone]        The results of significant diagnostics from this hospitalization (including imaging, microbiology, ancillary and laboratory) are listed below for reference.    Significant Diagnostic Studies: Dg Chest 2 View  09/14/2013   CLINICAL DATA:  Shortness of breath.  EXAM: CHEST  2 VIEW  COMPARISON:  None.  FINDINGS: There is hyperinflation of the lungs compatible with COPD. No confluent opacities or effusions. Heart is normal size. Mild peribronchial thickening and interstitial prominence, likely bronchitis.  IMPRESSION: COPD/ bronchitic changes.   Electronically Signed   By: Rolm Baptise M.D.   On: 09/14/2013 11:11   Dg Hip Complete Left  09/14/2013   CLINICAL DATA:  Fall, pain.  EXAM: LEFT HIP - COMPLETE 2+ VIEW  COMPARISON:  None.  FINDINGS: Mild symmetric joint space narrowing within the hip joints bilaterally. No acute bony abnormality. Specifically, no fracture, subluxation, or dislocation. Soft tissues are intact. Radiation seeds in the region of the prostate.  IMPRESSION: No acute bony abnormality.   Electronically Signed   By: Rolm Baptise M.D.   On: 09/14/2013  10:53   Dg Ankle Complete Left  09/14/2013   CLINICAL DATA:  Left ankle pain  EXAM: LEFT ANKLE  COMPLETE - 3+ VIEW  COMPARISON:  None.  FINDINGS: There is no evidence of fracture, dislocation, or joint effusion. There is no evidence of arthropathy or other focal bone abnormality. Soft tissues are unremarkable.  IMPRESSION: No acute abnormality is noted.   Electronically Signed   By: Inez Catalina M.D.   On: 09/14/2013 21:58   Dg Knee 4 Views W/patella Left  09/14/2013   CLINICAL DATA:  Traumatic injury and pain  EXAM: LEFT KNEE - COMPLETE 4+ VIEW  COMPARISON:  None.  FINDINGS: Mild irregularity of the fibular neck is noted which may represent displaced fracture. No other fractures are seen. No joint effusion or soft tissue abnormality is noted.  IMPRESSION: Possible undisplaced fibular neck fracture.   Electronically Signed   By: Inez Catalina M.D.   On: 09/14/2013 22:42    Microbiology: No results found for this or any previous visit (from the past 240 hour(s)).   Labs: Basic Metabolic Panel:  Recent Labs Lab 09/14/13 1010 09/14/13 1617 09/15/13 0610 09/17/13 0941  NA 139  --  140 142  K 4.0  --  3.9 4.3  CL 98  --  98 97  CO2 26  --  30 36*  GLUCOSE 125*  --  141* 156*  BUN 10  --  14 16  CREATININE 0.60 0.52 0.52 0.64  CALCIUM 9.4  --  9.4 9.7   Liver Function Tests:  Recent Labs Lab 09/14/13 1010 09/15/13 0610  AST 15 16  ALT 13 12  ALKPHOS 95 101  BILITOT 0.4 <0.2*  PROT 7.2 7.0  ALBUMIN 3.4* 3.2*   No results found for this basename: LIPASE, AMYLASE,  in the last 168 hours No results found for this basename: AMMONIA,  in the last 168 hours CBC:  Recent Labs Lab 09/14/13 1010 09/14/13 1617 09/15/13 0610  WBC 17.7* 16.6* 15.6*  NEUTROABS 16.0*  --   --   HGB 11.4* 10.9* 10.2*  HCT 34.8* 33.5* 31.5*  MCV 98.0 95.7 95.2  PLT PLATELET CLUMPS NOTED ON SMEAR, COUNT APPEARS ADEQUATE 141* 168   Cardiac Enzymes:  Recent Labs Lab 09/14/13 1010  CKTOTAL 82  TROPONINI <0.30   BNP: BNP (last 3 results)  Recent Labs  09/14/13 1620  PROBNP 227.7*    CBG: No results found for this basename: GLUCAP,  in the last 168 hours     Signed:  Verlee Monte  [facility] 09/17/2013, 12:15 PM

## 2013-11-04 DEATH — deceased

## 2015-10-16 IMAGING — CR DG ANKLE COMPLETE 3+V*L*
3 series · 3 of 3 positions shown · non-contrast
Comparison: None.

CLINICAL DATA: Left ankle pain

EXAM:
LEFT ANKLE COMPLETE - 3+ VIEW

[x ankle ap left]
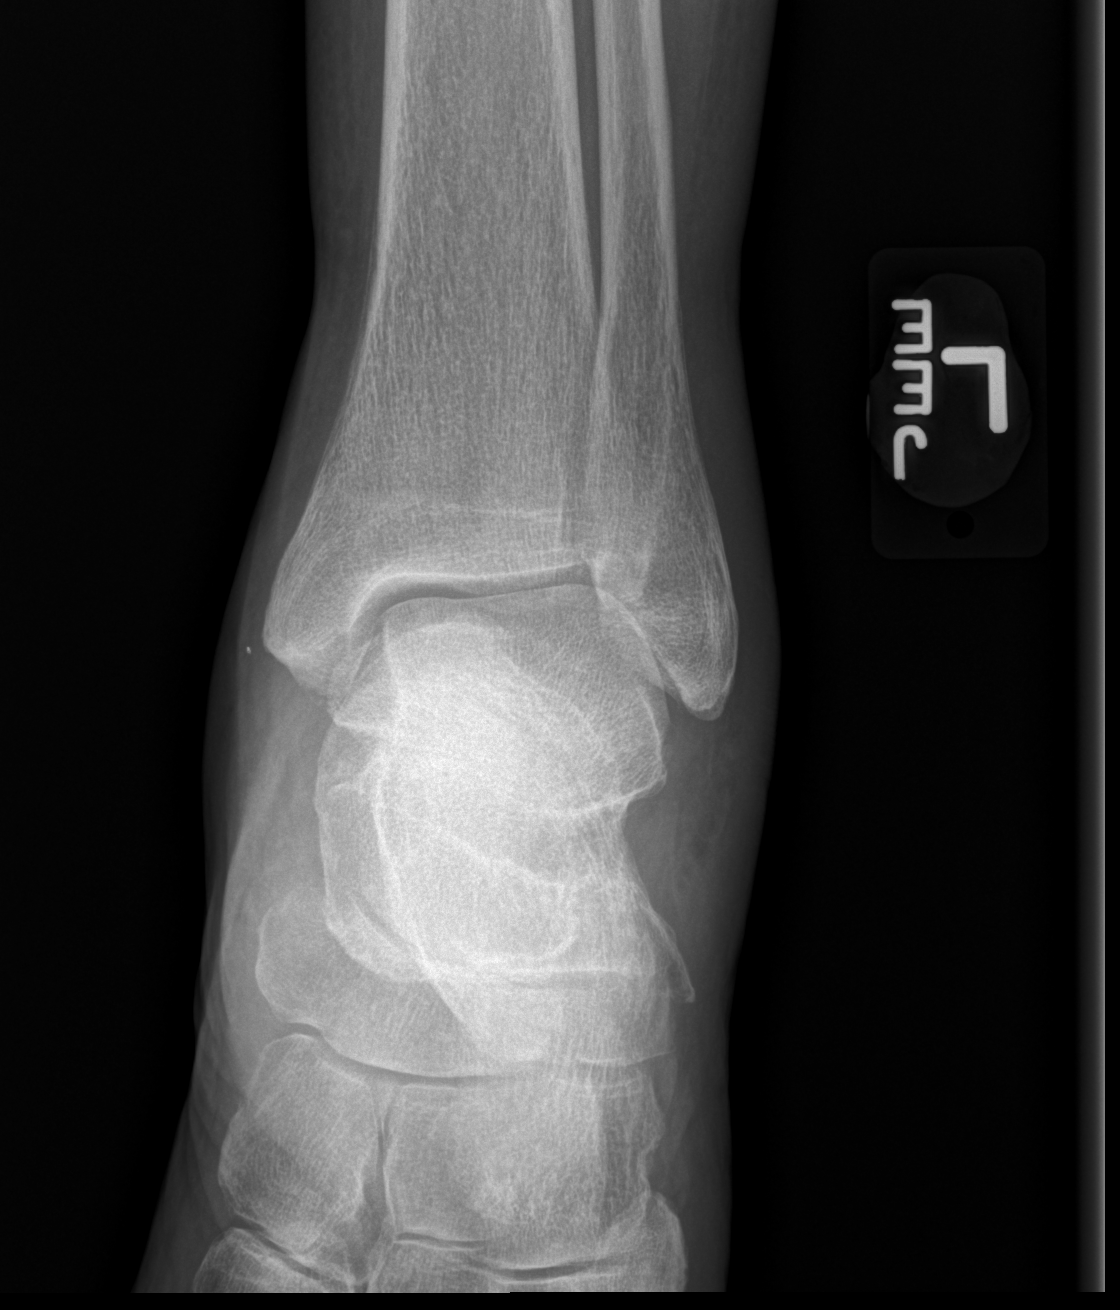

[x ankle obl left]
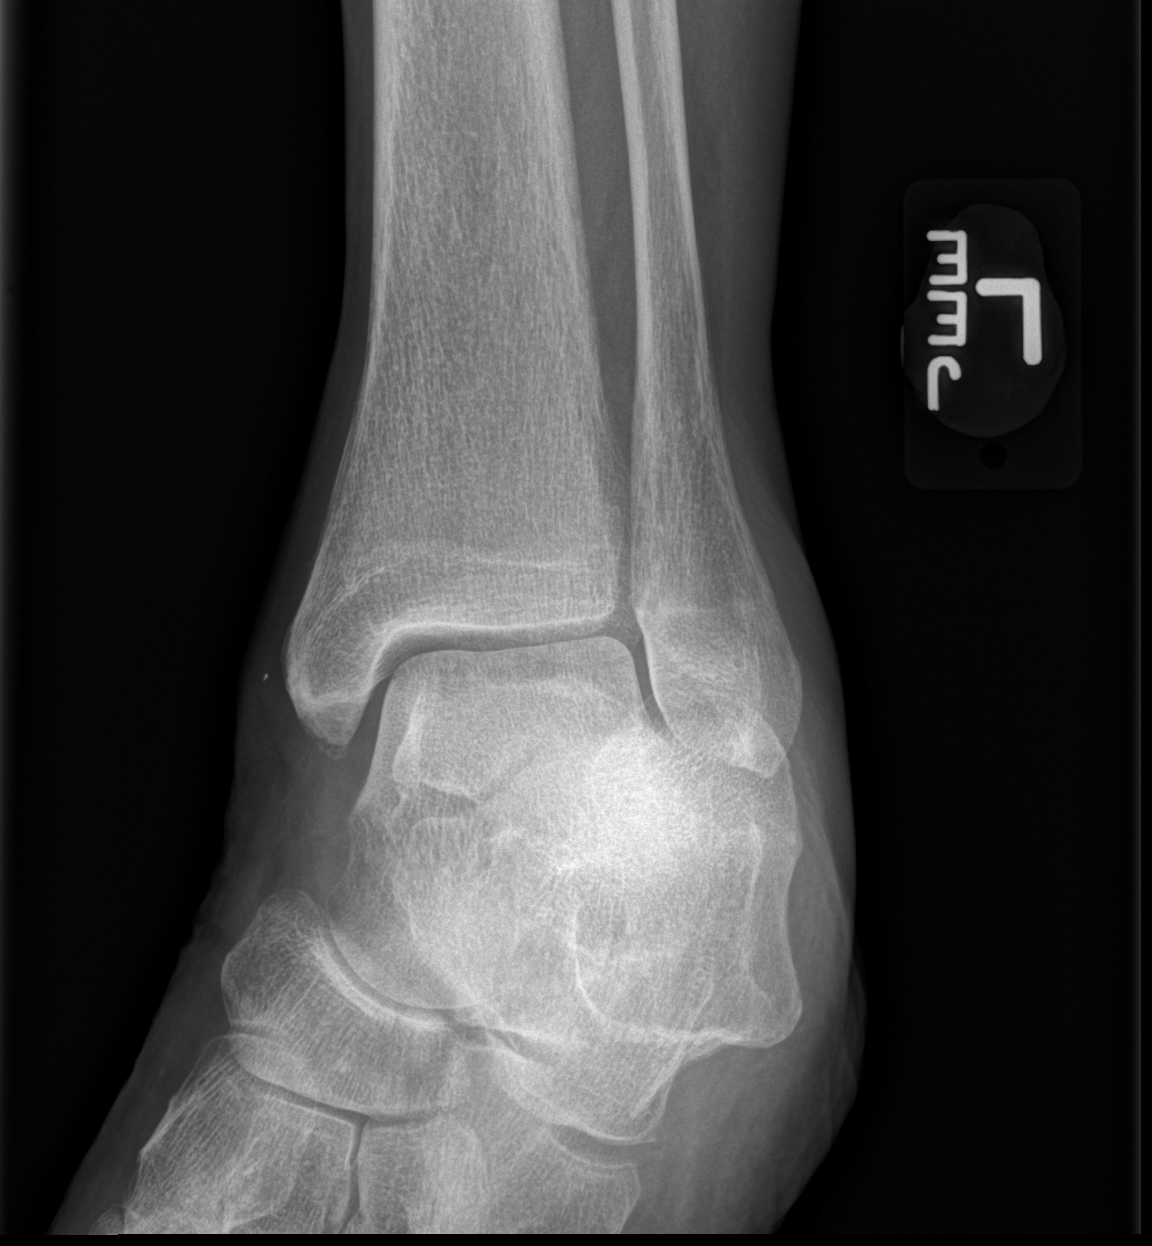

[x ankle lat left]
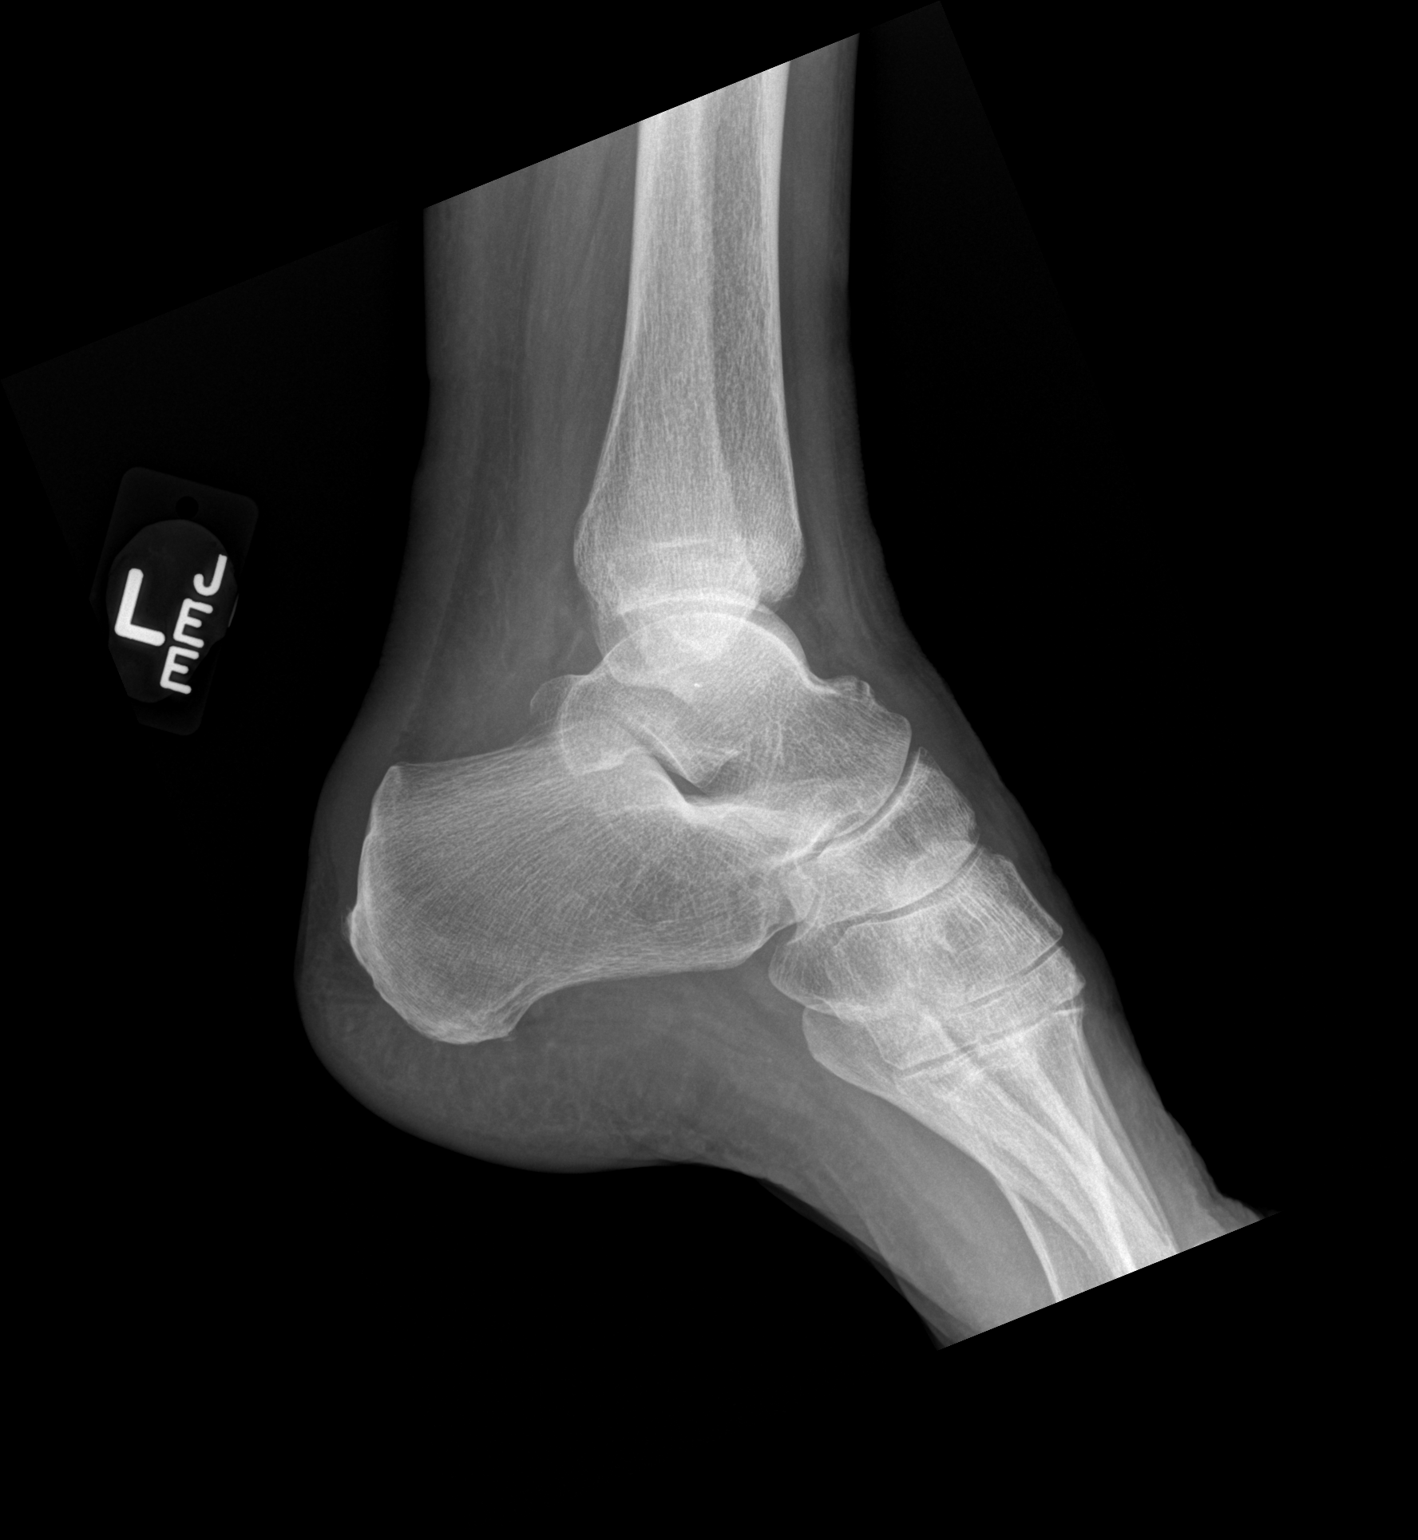

[3 of 3 positions shown; findings below may reference images not displayed]

FINDINGS: There is no evidence of fracture, dislocation, or joint effusion.
There is no evidence of arthropathy or other focal bone abnormality.
Soft tissues are unremarkable.
IMPRESSION: No acute abnormality is noted.
